# Patient Record
Sex: Male | Born: 1980 | Race: White | Hispanic: No | Marital: Single | Smoking: Former smoker
Health system: Southern US, Community
[De-identification: ages and names within clinical notes are randomized; demographics above are authoritative.]

## PROBLEM LIST (undated history)

## (undated) ENCOUNTER — Ambulatory Visit

## (undated) DIAGNOSIS — I10 Essential (primary) hypertension: Secondary | ICD-10-CM

## (undated) DIAGNOSIS — S060XAA Concussion with loss of consciousness status unknown, initial encounter: Secondary | ICD-10-CM

## (undated) DIAGNOSIS — S060X9A Concussion with loss of consciousness of unspecified duration, initial encounter: Secondary | ICD-10-CM

## (undated) DIAGNOSIS — F4310 Post-traumatic stress disorder, unspecified: Secondary | ICD-10-CM

## (undated) DIAGNOSIS — N3281 Overactive bladder: Secondary | ICD-10-CM

## (undated) DIAGNOSIS — E119 Type 2 diabetes mellitus without complications: Secondary | ICD-10-CM

## (undated) HISTORY — PX: CHOLECYSTECTOMY: SHX55

## (undated) HISTORY — PX: COLONOSCOPY: SHX174

---

## 2015-06-07 ENCOUNTER — Ambulatory Visit (HOSPITAL_BASED_OUTPATIENT_CLINIC_OR_DEPARTMENT_OTHER): Payer: 59

## 2015-06-07 ENCOUNTER — Encounter (HOSPITAL_BASED_OUTPATIENT_CLINIC_OR_DEPARTMENT_OTHER): Payer: 59

## 2015-06-14 ENCOUNTER — Ambulatory Visit (HOSPITAL_BASED_OUTPATIENT_CLINIC_OR_DEPARTMENT_OTHER): Payer: Managed Care, Other (non HMO)

## 2015-06-15 ENCOUNTER — Ambulatory Visit (HOSPITAL_BASED_OUTPATIENT_CLINIC_OR_DEPARTMENT_OTHER): Payer: 59 | Attending: Family Medicine | Admitting: Internal Medicine

## 2015-06-15 VITALS — Ht 72.0 in

## 2015-06-15 DIAGNOSIS — R0683 Snoring: Secondary | ICD-10-CM | POA: Diagnosis present

## 2015-06-15 DIAGNOSIS — G4730 Sleep apnea, unspecified: Secondary | ICD-10-CM | POA: Insufficient documentation

## 2015-06-15 DIAGNOSIS — G4710 Hypersomnia, unspecified: Secondary | ICD-10-CM | POA: Diagnosis not present

## 2015-06-30 ENCOUNTER — Other Ambulatory Visit (HOSPITAL_BASED_OUTPATIENT_CLINIC_OR_DEPARTMENT_OTHER): Payer: Self-pay

## 2015-06-30 DIAGNOSIS — G4710 Hypersomnia, unspecified: Secondary | ICD-10-CM

## 2015-06-30 DIAGNOSIS — G4730 Sleep apnea, unspecified: Secondary | ICD-10-CM

## 2015-06-30 DIAGNOSIS — R0683 Snoring: Secondary | ICD-10-CM

## 2015-07-04 DIAGNOSIS — G4710 Hypersomnia, unspecified: Secondary | ICD-10-CM

## 2015-07-04 DIAGNOSIS — R0683 Snoring: Secondary | ICD-10-CM

## 2015-07-04 DIAGNOSIS — G4730 Sleep apnea, unspecified: Secondary | ICD-10-CM | POA: Diagnosis not present

## 2015-07-04 NOTE — Procedures (Signed)
   Patient Name: Delorise RoyalsKospender, Maximilian Study Date: 06/17/2015 Gender: Male D.O.B: 1981-01-02 Age (years): 34 Referring Provider: Knox RoyaltyEnrico Jones Height (inches): 72 Interpreting Physician: Jetty Duhamellinton Ameka Krigbaum MD, ABSM Weight (lbs): 432 RPSGT: [place] SinkBarksdale, [place] BMI: 59 MRN: [id number] Neck Size: 19.50 CLINICAL INFORMATION Sleep Study Type: unattended home sleep test   Indication for sleep study: hypersomnia with sleep apnea   Epworth Sleepiness Score: 14 SLEEP STUDY TECHNIQUE A multi-channel overnight portable sleep study was performed. The channels recorded were: nasal airflow, thoracic respiratory movement, and oxygen saturation with a pulse oximetry. Snoring was also monitored. MEDICATIONS Patient self administered medications during sleep study include: none reported.  SLEEP ARCHITECTURE Patient was studied for 435.7 minutes. The sleep efficiency was 100.0 % and the patient was supine for 21%. The arousal index was 0.0 per hour.  RESPIRATORY PARAMETERS The overall AHI was 57.8 per hour, with a central apnea index of 0.0 per hour. The oxygen nadir was 71% during sleep.  CARDIAC DATA Mean heart rate during sleep was 80.1 bpm.  IMPRESSIONS - Severe obstructive sleep apnea occurred during this study (AHI = 57.8/h). - No significant central sleep apnea occurred during this study (CAI = 0.0/h). - Severe oxygen desaturation was noted during this study (Min O2 = 71%). - Patient snored 7.2% during the sleep. DIAGNOSIS - Obstructive Sleep Apnea (327.23 [G47.33 ICD-10]) - Nocturnal Hypoxemia (327.26 [G47.36 ICD-10])  RECOMMENDATIONS - Recommend CPAP titration and trial of CPAP therapy. - Positional therapy avoiding supine position during sleep. - Avoid alcohol, sedatives and other CNS depressants that may worsen sleep apnea and disrupt normal sleep architecture. - Sleep hygiene should be reviewed to assess factors that may improve sleep quality. - Weight management and regular exercise  should be initiated or continued.   Waymon BudgeYOUNG,Charles Andringa D Diplomate, American Board of Sleep Medicine  ELECTRONICALLY SIGNED ON:  07/04/2015, 2:32 PM  [facility] PH: [phone]   FX: [phone] ACCREDITED BY THE AMERICAN ACADEMY OF SLEEP MEDICINE

## 2015-07-12 ENCOUNTER — Emergency Department (HOSPITAL_BASED_OUTPATIENT_CLINIC_OR_DEPARTMENT_OTHER): Payer: Worker's Compensation

## 2015-07-12 ENCOUNTER — Emergency Department (HOSPITAL_BASED_OUTPATIENT_CLINIC_OR_DEPARTMENT_OTHER)
Admission: EM | Admit: 2015-07-12 | Discharge: 2015-07-12 | Disposition: A | Discharge status: Home / Self Care | Payer: Worker's Compensation | Attending: Emergency Medicine | Admitting: Emergency Medicine

## 2015-07-12 ENCOUNTER — Encounter (HOSPITAL_BASED_OUTPATIENT_CLINIC_OR_DEPARTMENT_OTHER): Payer: Self-pay | Admitting: *Deleted

## 2015-07-12 DIAGNOSIS — Y999 Unspecified external cause status: Secondary | ICD-10-CM | POA: Diagnosis not present

## 2015-07-12 DIAGNOSIS — W312XXA Contact with powered woodworking and forming machines, initial encounter: Secondary | ICD-10-CM | POA: Diagnosis not present

## 2015-07-12 DIAGNOSIS — Z7984 Long term (current) use of oral hypoglycemic drugs: Secondary | ICD-10-CM | POA: Insufficient documentation

## 2015-07-12 DIAGNOSIS — IMO0001 Reserved for inherently not codable concepts without codable children: Secondary | ICD-10-CM

## 2015-07-12 DIAGNOSIS — Y939 Activity, unspecified: Secondary | ICD-10-CM | POA: Insufficient documentation

## 2015-07-12 DIAGNOSIS — Y929 Unspecified place or not applicable: Secondary | ICD-10-CM | POA: Diagnosis not present

## 2015-07-12 DIAGNOSIS — E119 Type 2 diabetes mellitus without complications: Secondary | ICD-10-CM | POA: Diagnosis not present

## 2015-07-12 DIAGNOSIS — R030 Elevated blood-pressure reading, without diagnosis of hypertension: Secondary | ICD-10-CM | POA: Insufficient documentation

## 2015-07-12 DIAGNOSIS — I10 Essential (primary) hypertension: Secondary | ICD-10-CM | POA: Insufficient documentation

## 2015-07-12 DIAGNOSIS — S51812A Laceration without foreign body of left forearm, initial encounter: Secondary | ICD-10-CM | POA: Diagnosis not present

## 2015-07-12 DIAGNOSIS — S4992XA Unspecified injury of left shoulder and upper arm, initial encounter: Secondary | ICD-10-CM | POA: Diagnosis present

## 2015-07-12 DIAGNOSIS — IMO0002 Reserved for concepts with insufficient information to code with codable children: Secondary | ICD-10-CM

## 2015-07-12 HISTORY — DX: Type 2 diabetes mellitus without complications: E11.9

## 2015-07-12 HISTORY — DX: Essential (primary) hypertension: I10

## 2015-07-12 MED ORDER — CEPHALEXIN 250 MG PO CAPS
500.0000 mg | ORAL_CAPSULE | Freq: Once | ORAL | Status: AC
  Administered 2015-07-12: 500 mg via ORAL
  Filled 2015-07-12: qty 2

## 2015-07-12 MED ORDER — CEPHALEXIN 500 MG PO CAPS: 500.0000 mg | ORAL_CAPSULE | Freq: Four times a day (QID) | ORAL | Status: DC

## 2015-07-12 MED ORDER — BACITRACIN ZINC 500 UNIT/GM EX OINT
1.0000 "application " | TOPICAL_OINTMENT | Freq: Once | CUTANEOUS | Status: AC
  Administered 2015-07-12: 1 via TOPICAL

## 2015-07-12 MED ORDER — LIDOCAINE-EPINEPHRINE (PF) 2 %-1:200000 IJ SOLN
20.0000 mL | Freq: Once | INTRAMUSCULAR | Status: AC
  Administered 2015-07-12: 20 mL via INTRADERMAL
  Filled 2015-07-12: qty 20

## 2015-07-12 NOTE — Discharge Instructions (Signed)
Keep wound dry and do not remove dressing for 24 hours if possible. After that, wash gently morning and night (every 12 hours) with soap and water. Use a topical antibiotic ointment and cover with a bandaid or gauze.    Do NOT use rubbing alcohol or hydrogen peroxide, do not soak the area   Present to your primary care doctor or the urgent care of your choice, or the ED for suture removal in 7-10 days.   Every attempt was made to remove foreign body (contaminants) from the wound.  However, there is always a chance that some may remain in the wound. This can  increase your risk of infection.   If you see signs of infection (warmth, redness, tenderness, pus, sharp increase in pain, fever, red streaking in the skin) immediately return to the emergency department.   After the wound heals fully, apply sunscreen for 6-12 months to minimize scarring.   Please follow with your primary care doctor in the next 5 days for high blood pressure evaluation. If you do not have a primary care doctor, present to urgent care. Reduce salt intake. Seek emergency medical care for unilateral weakness, slurring, change in vision, or chest pain and shortness of breath.

## 2015-07-12 NOTE — ED Notes (Signed)
Supplies to bedside for wound repair.

## 2015-07-12 NOTE — ED Provider Notes (Signed)
CSN: [id number]     Arrival date & time 07/12/15  1931 History   First MD Initiated Contact with Patient 07/12/15 2047     Chief Complaint  Patient presents with  . Arm Injury     (Consider location/radiation/quality/duration/timing/severity/associated sxs/prior Treatment) Patient is a 35 y.o. male presenting with arm injury.  Arm Injury   Blood pressure 162/100, pulse 87, temperature 99.3 F (37.4 C), temperature source Oral, resp. rate 20, height 6' (1.829 m), weight 193.686 kg, SpO2 98 %.  Isaiah Sullivan is a 35 y.o. male complaining of laceration to left forearm, patient was standing with his dominant (right) forearm, he turned to speak to somebody behind him and the sander went into his left hand. States his last tetanus shot was within last 5 years, pain is minimal, bleeding is controlled. Patient denies weakness, numbness, reduced range of motion.  Past Medical History  Diagnosis Date  . Diabetes mellitus without complication (HCC)   . Hypertension    Past Surgical History  Procedure Laterality Date  . Cholecystectomy     No family history on file. Social History  Substance Use Topics  . Smoking status: Never Smoker   . Smokeless tobacco: None  . Alcohol Use: Yes    Review of Systems  10 systems reviewed and found to be negative, except as noted in the HPI.   Allergies  Review of patient's allergies indicates no known allergies.  Home Medications   Prior to Admission medications   Medication Sig Start Date End Date Taking? Authorizing Provider  METFORMIN HCL PO Take by mouth.   Yes Historical Provider, MD   BP 162/100 mmHg  Pulse 87  Temp(Src) 99.3 F (37.4 C) (Oral)  Resp 20  Ht 6' (1.829 m)  Wt 193.686 kg  BMI 57.90 kg/m2  SpO2 98% Physical Exam  Constitutional: He is oriented to person, place, and time. He appears well-developed and well-nourished. No distress.  HENT:  Head: Normocephalic.  Eyes: Conjunctivae and EOM are normal.    Cardiovascular: Normal rate, regular rhythm and intact distal pulses.   Pulmonary/Chest: Effort normal. No stridor.  Musculoskeletal: Normal range of motion.  Neurological: He is alert and oriented to person, place, and time.  Skin:     3 cm full-thickness non-jagged laceration as diagrammed full range of motion to the fingers, distally neurovascularly intact.  Psychiatric: He has a normal mood and affect.  Nursing note and vitals reviewed.   ED Course  .Marland Kitchen.Laceration Repair Date/Time: 07/12/2015 9:47 PM Performed by: Wynetta EmeryPISCIOTTA, Raylene Carmickle Authorized by: Wynetta EmeryPISCIOTTA, Kenyotta Dorfman Consent: Verbal consent obtained. Risks and benefits: risks, benefits and alternatives were discussed Consent given by: patient Required items: required blood products, implants, devices, and special equipment available Patient identity confirmed: verbally with patient Body area: upper extremity Location details: left lower arm Laceration length: 3 cm Foreign bodies: no foreign bodies Tendon involvement: none Nerve involvement: none Vascular damage: no Anesthesia: local infiltration Local anesthetic: lidocaine 2% with epinephrine Anesthetic total: 5 ml Patient sedated: no Preparation: Patient was prepped and draped in the usual sterile fashion. Irrigation solution: saline Irrigation method: syringe Amount of cleaning: standard Debridement: none Degree of undermining: none Skin closure: Ethilon (4-0) Number of sutures: 4 Technique: running Approximation: close Approximation difficulty: simple Dressing: antibiotic ointment Patient tolerance: Patient tolerated the procedure well with no immediate complications   (including critical care time) Labs Review Labs Reviewed - No data to display  Imaging Review Dg Forearm Left  07/12/2015  CLINICAL DATA:  Left forearm injury.  EXAM: LEFT FOREARM - 2 VIEW COMPARISON:  None. FINDINGS: There is no evidence of fracture or other focal bone lesions. Soft tissues are  unremarkable. IMPRESSION: Normal. Electronically Signed   By: Signa Kell M.D.   On: 07/12/2015 20:16   I have personally reviewed and evaluated these images and lab results as part of my medical decision-making.   EKG Interpretation None      MDM   Final diagnoses:  Laceration  Elevated blood pressure    Filed Vitals:   07/12/15 1942  BP: 162/100  Pulse: 87  Temp: 99.3 F (37.4 C)  TempSrc: Oral  Resp: 20  Height: 6' (1.829 m)  Weight: 193.686 kg  SpO2: 98%    Medications  bacitracin ointment 1 application (not administered)  lidocaine-EPINEPHrine (XYLOCAINE W/EPI) 2 %-1:200000 (PF) injection 20 mL (20 mLs Intradermal Given by Other 07/12/15 2107)  cephALEXin (KEFLEX) capsule 500 mg (500 mg Oral Given 07/12/15 2107)    Isaiah Sullivan is 35 y.o. male presenting with Laceration to left forearm, neurovascularly intact, no tendon involvement. He states his tetanus shot is up-to-date, wound is cleaned and closed, patient is started on Keflex as he is a diabetic.   Evaluation does not show pathology that would require ongoing emergent intervention or inpatient treatment. Pt is hemodynamically stable and mentating appropriately. Discussed findings and plan with patient/guardian, who agrees with care plan. All questions answered. Return precautions discussed and outpatient follow up given.   New Prescriptions   CEPHALEXIN (KEFLEX) 500 MG CAPSULE    Take 1 capsule (500 mg total) by mouth 4 (four) times daily.         Wynetta Emery, PA-C 07/12/15 2209  Geoffery Lyons, MD 07/12/15 [phone]

## 2015-07-12 NOTE — ED Notes (Signed)
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 No drug screen required.

## 2015-07-12 NOTE — ED Notes (Signed)
PA at bedside for wound repair.

## 2016-03-13 ENCOUNTER — Ambulatory Visit: Payer: Self-pay | Admitting: Orthopedic Surgery

## 2016-04-06 ENCOUNTER — Encounter (HOSPITAL_COMMUNITY)
Admission: RE | Admit: 2016-04-06 | Discharge: 2016-04-06 | Disposition: A | Discharge status: Home / Self Care | Payer: Worker's Compensation | Source: Ambulatory Visit | Attending: Orthopedic Surgery | Admitting: Orthopedic Surgery

## 2016-04-06 ENCOUNTER — Encounter (HOSPITAL_COMMUNITY): Payer: Self-pay

## 2016-04-06 DIAGNOSIS — G5600 Carpal tunnel syndrome, unspecified upper limb: Secondary | ICD-10-CM | POA: Diagnosis not present

## 2016-04-06 DIAGNOSIS — Z01812 Encounter for preprocedural laboratory examination: Secondary | ICD-10-CM | POA: Diagnosis present

## 2016-04-06 DIAGNOSIS — Z01810 Encounter for preprocedural cardiovascular examination: Secondary | ICD-10-CM | POA: Diagnosis present

## 2016-04-06 DIAGNOSIS — I10 Essential (primary) hypertension: Secondary | ICD-10-CM | POA: Insufficient documentation

## 2016-04-06 HISTORY — DX: Concussion with loss of consciousness status unknown, initial encounter: S06.0XAA

## 2016-04-06 HISTORY — DX: Concussion with loss of consciousness of unspecified duration, initial encounter: S06.0X9A

## 2016-04-06 HISTORY — DX: Post-traumatic stress disorder, unspecified: F43.10

## 2016-04-06 LAB — BASIC METABOLIC PANEL
Anion gap: 7 (ref 5–15)
BUN: 12 mg/dL (ref 6–20)
CO2: 26 mmol/L (ref 22–32)
Calcium: 9.5 mg/dL (ref 8.9–10.3)
Chloride: 106 mmol/L (ref 101–111)
Creatinine, Ser: 0.8 mg/dL (ref 0.61–1.24)
Glucose, Bld: 122 mg/dL — ABNORMAL HIGH (ref 65–99)
Potassium: 3.8 mmol/L (ref 3.5–5.1)
SODIUM: 139 mmol/L (ref 135–145)

## 2016-04-06 LAB — GLUCOSE, CAPILLARY: GLUCOSE-CAPILLARY: 105 mg/dL — AB (ref 65–99)

## 2016-04-06 LAB — CBC
HEMATOCRIT: 43.6 % (ref 39.0–52.0)
Hemoglobin: 14.2 g/dL (ref 13.0–17.0)
MCH: 27.3 pg (ref 26.0–34.0)
MCHC: 32.6 g/dL (ref 30.0–36.0)
MCV: 83.7 fL (ref 78.0–100.0)
PLATELETS: 276 10*3/uL (ref 150–400)
RBC: 5.21 MIL/uL (ref 4.22–5.81)
RDW: 14.6 % (ref 11.5–15.5)
WBC: 11.2 10*3/uL — AB (ref 4.0–10.5)

## 2016-04-06 NOTE — Pre-Procedure Instructions (Addendum)
Isaiah Sullivan  04/06/2016    Your procedure is scheduled on Thursday, February 8.  [facility]:00 AM                For any other questions, please call [phone], Monday - Friday 8 AM - 4 PM.    Call this number if you have problems the morning of surgery: [phone]                For any other questions, please call [phone], Monday - Friday 8 AM - 4 PM.     Remember:  Do not eat food or drink liquids after midnight Wednesday, February 7.  Take these medicines the morning of surgery with A SIP OF WATER:atorvastatin (LIPITOR).                   DO NOT Take   metFORMIN (GLUCOPHAGE) the Morning of Surgery.             1 Week prior to surgery STOP taking Aspirin, Aspirin Products (Goody Powder, Excedrin Migraine), Ibuprofen (Advil), Naproxen (Aleve), Ibuprofen- Famotidine, Vitamins and Herbal Products (ie Fish Oil).  How to Manage Your Diabetes Before and After Surgery  Why is it important to control my blood sugar before and after surgery? . Improving blood sugar levels before and after surgery helps healing and can limit problems. . A way of improving blood sugar control is eating a healthy diet by: o  Eating less sugar and carbohydrates o  Increasing activity/exercise o  Talking with your doctor about reaching your blood sugar goals . High blood sugars (greater than 180 mg/dL) can raise your risk of infections and slow your recovery, so you will need to focus on controlling your diabetes during the weeks before surgery. . Make sure that the doctor who takes care of your diabetes knows about your planned surgery including the date and location.  How do I manage my blood sugar before surgery? . Check your blood sugar at least 4 times a day, starting 2 days before surgery, to make sure that the level is not too high or low. o Check your blood sugar the morning of your surgery when you wake up and every 2 hours until you get to the  Short Stay unit. . If your blood sugar is less than 70 mg/dL, you will need to treat for low blood sugar: o Do not take insulin. o Treat a low blood sugar (less than 70 mg/dL) with  cup of clear juice (cranberry or apple), 4 glucose tablets, OR glucose gel. o Recheck blood sugar in 15 minutes after treatment (to make sure it is greater than 70 mg/dL). If your blood sugar is not greater than 70 mg/dL on recheck, call [phone] for further instructions. . Report your blood sugar to the short stay nurse when you get to Short Stay.  . If you are admitted to the hospital after surgery: o Your blood sugar will be checked by the staff and you will probably be given insulin after surgery (instead of oral diabetes medicines) to make sure you have good blood sugar levels. o The goal for blood sugar control after surgery is 80-180 mg/dL.   WHAT DO I DO ABOUT MY DIABETES MEDICATION?  Marland Kitchen. Do not take oral diabetes medicines (pills) the morning of surgery.  Patient Signature:  Date:   Nurse Signature:  Date:    Do not wear jewelry, make-up or nail polish.  Do not wear lotions, powders, or perfumes, or deodorant.   Men may shave face and neck.  Do not bring valuables to the hospital.  [facility] is not responsible for any belongings or valuables.  Contacts, dentures or bridgework may not be worn into surgery.  Leave your suitcase in the car.  After surgery it may be brought to your room.  For patients admitted to the hospital, discharge time will be determined by your treatment team.  Patients discharged the day of surgery will not be allowed to drive home.   Name and phone number of your driver:     Special instructions: Review  [facility] - Preparing For Surgery.  Please read over the following fact sheets that you were given: [facility]- Preparing For Surgery and Patient Instructions for Mupirocin Application, Pain Booklet  Patient Signature:  Date:   Nurse Signature:  Date:

## 2016-04-07 LAB — HEMOGLOBIN A1C
Hgb A1c MFr Bld: 6.8 % — ABNORMAL HIGH (ref 4.8–5.6)
MEAN PLASMA GLUCOSE: 148 mg/dL

## 2016-04-12 MED ORDER — DEXTROSE 5 % IV SOLN
3.0000 g | INTRAVENOUS | Status: AC
  Administered 2016-04-13: 3 g via INTRAVENOUS
  Filled 2016-04-12: qty 3000

## 2016-04-13 ENCOUNTER — Ambulatory Visit (HOSPITAL_COMMUNITY): Payer: Worker's Compensation | Admitting: Anesthesiology

## 2016-04-13 ENCOUNTER — Encounter (HOSPITAL_COMMUNITY)
Admission: RE | Disposition: A | Discharge status: Home / Self Care | Payer: Self-pay | Source: Ambulatory Visit | Attending: Orthopedic Surgery

## 2016-04-13 ENCOUNTER — Ambulatory Visit (HOSPITAL_COMMUNITY)
Admission: RE | Admit: 2016-04-13 | Discharge: 2016-04-13 | Disposition: A | Discharge status: Home / Self Care | Payer: Worker's Compensation | Source: Ambulatory Visit | Attending: Orthopedic Surgery | Admitting: Orthopedic Surgery

## 2016-04-13 ENCOUNTER — Encounter (HOSPITAL_COMMUNITY): Payer: Self-pay | Admitting: *Deleted

## 2016-04-13 ENCOUNTER — Ambulatory Visit (HOSPITAL_COMMUNITY): Payer: Worker's Compensation | Admitting: Emergency Medicine

## 2016-04-13 DIAGNOSIS — F4310 Post-traumatic stress disorder, unspecified: Secondary | ICD-10-CM | POA: Diagnosis not present

## 2016-04-13 DIAGNOSIS — I10 Essential (primary) hypertension: Secondary | ICD-10-CM | POA: Insufficient documentation

## 2016-04-13 DIAGNOSIS — E119 Type 2 diabetes mellitus without complications: Secondary | ICD-10-CM | POA: Insufficient documentation

## 2016-04-13 DIAGNOSIS — Z79899 Other long term (current) drug therapy: Secondary | ICD-10-CM | POA: Insufficient documentation

## 2016-04-13 DIAGNOSIS — G5601 Carpal tunnel syndrome, right upper limb: Secondary | ICD-10-CM | POA: Diagnosis present

## 2016-04-13 DIAGNOSIS — Z7984 Long term (current) use of oral hypoglycemic drugs: Secondary | ICD-10-CM | POA: Diagnosis not present

## 2016-04-13 DIAGNOSIS — Z87891 Personal history of nicotine dependence: Secondary | ICD-10-CM | POA: Insufficient documentation

## 2016-04-13 HISTORY — PX: CARPAL TUNNEL RELEASE: SHX101

## 2016-04-13 LAB — GLUCOSE, CAPILLARY
GLUCOSE-CAPILLARY: 93 mg/dL (ref 65–99)
Glucose-Capillary: 93 mg/dL (ref 65–99)

## 2016-04-13 SURGERY — CARPAL TUNNEL RELEASE
Anesthesia: Monitor Anesthesia Care | Site: Wrist | Laterality: Right

## 2016-04-13 MED ORDER — ONDANSETRON HCL 4 MG/2ML IJ SOLN
INTRAMUSCULAR | Status: AC
  Filled 2016-04-13: qty 2

## 2016-04-13 MED ORDER — LIDOCAINE 2% (20 MG/ML) 5 ML SYRINGE
INTRAMUSCULAR | Status: AC
  Filled 2016-04-13: qty 15

## 2016-04-13 MED ORDER — 0.9 % SODIUM CHLORIDE (POUR BTL) OPTIME
TOPICAL | Status: DC | PRN
  Administered 2016-04-13: 1000 mL

## 2016-04-13 MED ORDER — PROPOFOL 10 MG/ML IV BOLUS
INTRAVENOUS | Status: AC
  Filled 2016-04-13: qty 20

## 2016-04-13 MED ORDER — LIDOCAINE HCL (PF) 1 % IJ SOLN
INTRAMUSCULAR | Status: DC | PRN
  Administered 2016-04-13: 30 mL

## 2016-04-13 MED ORDER — FENTANYL CITRATE (PF) 100 MCG/2ML IJ SOLN
25.0000 ug | INTRAMUSCULAR | Status: DC | PRN
  Administered 2016-04-13 (×3): 50 ug via INTRAVENOUS

## 2016-04-13 MED ORDER — OXYCODONE-ACETAMINOPHEN 10-325 MG PO TABS: 1.0000 | ORAL_TABLET | ORAL | 0 refills | Status: DC | PRN

## 2016-04-13 MED ORDER — MIDAZOLAM HCL 5 MG/5ML IJ SOLN
INTRAMUSCULAR | Status: DC | PRN
  Administered 2016-04-13 (×2): 1 mg via INTRAVENOUS

## 2016-04-13 MED ORDER — FENTANYL CITRATE (PF) 100 MCG/2ML IJ SOLN
INTRAMUSCULAR | Status: DC | PRN
  Administered 2016-04-13: 100 ug via INTRAVENOUS

## 2016-04-13 MED ORDER — FENTANYL CITRATE (PF) 100 MCG/2ML IJ SOLN
INTRAMUSCULAR | Status: AC
  Filled 2016-04-13: qty 2

## 2016-04-13 MED ORDER — MIDAZOLAM HCL 2 MG/2ML IJ SOLN
INTRAMUSCULAR | Status: AC
  Filled 2016-04-13: qty 2

## 2016-04-13 MED ORDER — PROPOFOL 10 MG/ML IV BOLUS
INTRAVENOUS | Status: DC | PRN
  Administered 2016-04-13: 20 mg via INTRAVENOUS

## 2016-04-13 MED ORDER — PROPOFOL 500 MG/50ML IV EMUL
INTRAVENOUS | Status: DC | PRN
  Administered 2016-04-13: 50 ug/kg/min via INTRAVENOUS

## 2016-04-13 MED ORDER — CHLORHEXIDINE GLUCONATE 4 % EX LIQD: 60.0000 mL | Freq: Once | CUTANEOUS | Status: DC

## 2016-04-13 MED ORDER — SUGAMMADEX SODIUM 200 MG/2ML IV SOLN
INTRAVENOUS | Status: AC
  Filled 2016-04-13: qty 2

## 2016-04-13 MED ORDER — BUPIVACAINE HCL (PF) 0.25 % IJ SOLN
INTRAMUSCULAR | Status: AC
  Filled 2016-04-13: qty 30

## 2016-04-13 MED ORDER — BUPIVACAINE HCL (PF) 0.25 % IJ SOLN
INTRAMUSCULAR | Status: DC | PRN
  Administered 2016-04-13: 30 mL

## 2016-04-13 MED ORDER — LIDOCAINE HCL (PF) 1 % IJ SOLN
INTRAMUSCULAR | Status: AC
  Filled 2016-04-13: qty 30

## 2016-04-13 MED ORDER — DEXAMETHASONE SODIUM PHOSPHATE 10 MG/ML IJ SOLN
INTRAMUSCULAR | Status: AC
  Filled 2016-04-13: qty 1

## 2016-04-13 MED ORDER — LACTATED RINGERS IV SOLN
INTRAVENOUS | Status: DC | PRN
  Administered 2016-04-13: 13:00:00 via INTRAVENOUS

## 2016-04-13 MED ORDER — FENTANYL CITRATE (PF) 100 MCG/2ML IJ SOLN
INTRAMUSCULAR | Status: DC
Start: 2016-04-13 — End: 2016-04-13
  Filled 2016-04-13: qty 2

## 2016-04-13 MED ORDER — FENTANYL CITRATE (PF) 100 MCG/2ML IJ SOLN
INTRAMUSCULAR | Status: AC
  Filled 2016-04-13: qty 4

## 2016-04-13 SURGICAL SUPPLY — 47 items
BANDAGE ACE 4X5 VEL STRL LF (GAUZE/BANDAGES/DRESSINGS) ×3 IMPLANT
BANDAGE ELASTIC 3 VELCRO ST LF (GAUZE/BANDAGES/DRESSINGS) ×3 IMPLANT
BLADE CARPAL TUNNEL SNGL USE (BLADE) ×3 IMPLANT
BNDG GAUZE ELAST 4 BULKY (GAUZE/BANDAGES/DRESSINGS) ×3 IMPLANT
CORDS BIPOLAR (ELECTRODE) ×3 IMPLANT
COVER SURGICAL LIGHT HANDLE (MISCELLANEOUS) ×3 IMPLANT
CUFF TOURNIQUET SINGLE 18IN (TOURNIQUET CUFF) ×3 IMPLANT
CUFF TOURNIQUET SINGLE 24IN (TOURNIQUET CUFF) IMPLANT
DRAPE SURG 17X23 STRL (DRAPES) ×3 IMPLANT
EVACUATOR 1/8 PVC DRAIN (DRAIN) IMPLANT
GAUZE SPONGE 4X4 12PLY STRL (GAUZE/BANDAGES/DRESSINGS) ×3 IMPLANT
GAUZE XEROFORM 1X8 LF (GAUZE/BANDAGES/DRESSINGS) ×3 IMPLANT
GLOVE BIOGEL M 8.0 STRL (GLOVE) ×3 IMPLANT
GLOVE SS BIOGEL STRL SZ 8 (GLOVE) ×1 IMPLANT
GLOVE SUPERSENSE BIOGEL SZ 8 (GLOVE) ×2
GOWN STRL REUS W/ TWL LRG LVL3 (GOWN DISPOSABLE) ×2 IMPLANT
GOWN STRL REUS W/ TWL XL LVL3 (GOWN DISPOSABLE) ×3 IMPLANT
GOWN STRL REUS W/TWL LRG LVL3 (GOWN DISPOSABLE) ×4
GOWN STRL REUS W/TWL XL LVL3 (GOWN DISPOSABLE) ×6
KIT BASIN OR (CUSTOM PROCEDURE TRAY) ×3 IMPLANT
KIT ROOM TURNOVER OR (KITS) ×3 IMPLANT
LOOP VESSEL MAXI BLUE (MISCELLANEOUS) IMPLANT
NEEDLE HYPO 25GX1X1/2 BEV (NEEDLE) IMPLANT
NS IRRIG 1000ML POUR BTL (IV SOLUTION) ×3 IMPLANT
PACK ORTHO EXTREMITY (CUSTOM PROCEDURE TRAY) ×3 IMPLANT
PAD ABD 8X10 STRL (GAUZE/BANDAGES/DRESSINGS) ×6 IMPLANT
PAD ARMBOARD 7.5X6 YLW CONV (MISCELLANEOUS) ×6 IMPLANT
PAD CAST 3X4 CTTN HI CHSV (CAST SUPPLIES) ×2 IMPLANT
PAD CAST 4YDX4 CTTN HI CHSV (CAST SUPPLIES) ×2 IMPLANT
PADDING CAST COTTON 3X4 STRL (CAST SUPPLIES) ×4
PADDING CAST COTTON 4X4 STRL (CAST SUPPLIES) ×4
SCRUB BETADINE 4OZ XXX (MISCELLANEOUS) ×3 IMPLANT
SOLUTION BETADINE 4OZ (MISCELLANEOUS) ×6 IMPLANT
SPONGE SCRUB IODOPHOR (GAUZE/BANDAGES/DRESSINGS) ×3 IMPLANT
SUT PROLENE 4 0 PS 2 18 (SUTURE) ×3 IMPLANT
SUT VIC AB 2-0 CT1 27 (SUTURE)
SUT VIC AB 2-0 CT1 TAPERPNT 27 (SUTURE) IMPLANT
SUT VIC AB 3-0 FS2 27 (SUTURE) IMPLANT
SYR CONTROL 10ML LL (SYRINGE) IMPLANT
SYSTEM CHEST DRAIN TLS 7FR (DRAIN) IMPLANT
TOWEL OR 17X24 6PK STRL BLUE (TOWEL DISPOSABLE) ×3 IMPLANT
TOWEL OR 17X26 10 PK STRL BLUE (TOWEL DISPOSABLE) ×3 IMPLANT
TUBE CONNECTING 12'X1/4 (SUCTIONS)
TUBE CONNECTING 12X1/4 (SUCTIONS) IMPLANT
TUBE EVACUATION TLS (MISCELLANEOUS) ×3 IMPLANT
UNDERPAD 30X30 (UNDERPADS AND DIAPERS) ×3 IMPLANT
WATER STERILE IRR 1000ML POUR (IV SOLUTION) ×3 IMPLANT

## 2016-04-13 NOTE — Progress Notes (Signed)
3Orthopedic Tech Progress Note Patient Details:  Jaclynn GuarneriBryan Tomassi 1980/04/09 [id number]  Ortho Devices Type of Ortho Device: Arm sling Ortho Device/Splint Location: RUE Ortho Device/Splint Interventions: Ordered, Application   Jennye MoccasinHughes, Jahaan Vanwagner Craig 04/13/2016, 3:17 PM

## 2016-04-13 NOTE — Anesthesia Preprocedure Evaluation (Addendum)
Anesthesia Evaluation  Patient identified by MRN, date of birth, ID band Patient awake    Reviewed: Allergy & Precautions, NPO status , Patient's Chart, lab work & pertinent test results  Airway Mallampati: II  TM Distance: >3 FB Neck ROM: Full    Dental  (+) Teeth Intact, Dental Advisory Given   Pulmonary former smoker,    breath sounds clear to auscultation       Cardiovascular hypertension, Pt. on medications  Rhythm:Regular Rate:Normal     Neuro/Psych negative neurological ROS     GI/Hepatic negative GI ROS, Neg liver ROS,   Endo/Other  diabetes, Type 2, Oral Hypoglycemic AgentsMorbid obesity  Renal/GU negative Renal ROS     Musculoskeletal   Abdominal   Peds  Hematology   Anesthesia Other Findings   Reproductive/Obstetrics                            Lab Results  Component Value Date   WBC 11.2 (H) 04/06/2016   HGB 14.2 04/06/2016   HCT 43.6 04/06/2016   MCV 83.7 04/06/2016   PLT 276 04/06/2016   Lab Results  Component Value Date   CREATININE 0.80 04/06/2016   BUN 12 04/06/2016   NA 139 04/06/2016   K 3.8 04/06/2016   CL 106 04/06/2016   CO2 26 04/06/2016    Anesthesia Physical Anesthesia Plan  ASA: III  Anesthesia Plan: MAC   Post-op Pain Management:    Induction: Intravenous  Airway Management Planned: Natural Airway and Simple Face Mask  Additional Equipment: None  Intra-op Plan:   Post-operative Plan:   Informed Consent: I have reviewed the patients History and Physical, chart, labs and discussed the procedure including the risks, benefits and alternatives for the proposed anesthesia with the patient or authorized representative who has indicated his/her understanding and acceptance.   Dental advisory given  Plan Discussed with: CRNA, Anesthesiologist and Surgeon  Anesthesia Plan Comments:        Anesthesia Quick Evaluation

## 2016-04-13 NOTE — Discharge Instructions (Signed)

## 2016-04-13 NOTE — H&P (Signed)
Isaiah Sullivan is an 36 y.o. male.   Chief Complaint: right hand carpal tunnel syndrome HPI: 36 yo male with history  Of right carpal tunnel syndrome. Patient has attempted conservative treatment without resolution. Patient desires surgical intervention. Risks and benefits have been discussed at length.  Past Medical History:  Diagnosis Date  . Diabetes mellitus without complication (HCC)    Type II  . Head injury, closed, with concussion   . Hypertension   . PTSD (post-traumatic stress disorder)     Past Surgical History:  Procedure Laterality Date  . CHOLECYSTECTOMY    . COLONOSCOPY      History reviewed. No pertinent family history. Social History:  reports that he has quit smoking. He quit after 5.00 years of use. He has never used smokeless tobacco. He reports that he drinks about 0.6 oz of alcohol per week . He reports that he does not use drugs.  Allergies:  Allergies  Allergen Reactions  . No Known Allergies     Medications Prior to Admission  Medication Sig Dispense Refill  . atorvastatin (LIPITOR) 40 MG tablet Take 40 mg by mouth daily.    . calcium carbonate (TUMS - DOSED IN MG ELEMENTAL CALCIUM) 500 MG chewable tablet Chew 1 tablet by mouth as needed for indigestion or heartburn.    . Ibuprofen-Famotidine (DUEXIS) 800-26.6 MG TABS Take 1 tablet by mouth 3 (three) times daily.    Marland Kitchen losartan (COZAAR) 50 MG tablet Take 50 mg by mouth daily.    . metFORMIN (GLUCOPHAGE) 500 MG tablet Take 500 mg by mouth 2 (two) times daily with a meal.    . ranitidine (ZANTAC) 150 MG tablet Take 150 mg by mouth 2 (two) times daily.    Marland Kitchen omeprazole (PRILOSEC OTC) 20 MG tablet Take 20 mg by mouth as needed.      Results for orders placed or performed during the hospital encounter of 04/13/16 (from the past 48 hour(s))  Glucose, capillary     Status: None   Collection Time: 04/13/16 11:20 AM  Result Value Ref Range   Glucose-Capillary 93 65 - 99 mg/dL   Comment 1 Notify RN    Comment 2 Document in Chart    No results found.  Review of Systems  Constitutional: Negative.   Eyes: Negative.   Respiratory: Negative.   Cardiovascular: Negative.   Musculoskeletal:       See HPI  Skin: Negative.     Blood pressure (!) 150/85, pulse 83, temperature 98.4 F (36.9 C), temperature source Oral, resp. rate 18, weight (!) 205 kg (452 lb), SpO2 98 %. Physical Exam  The patient is alert and oriented in no acute distress. The patient complains of pain in the affected upper extremity.  The patient is noted to have a normal HEENT exam. Lung fields show equal chest expansion and no shortness of breath. Abdomen exam is nontender without distention. Lower extremity examination does not show any fracture dislocation or blood clot symptoms. Pelvis is stable and the neck and back are stable and nontender. RUE: positive tinels, positive mnc, phalens positive, no advanced atrophy, rom intact Assessment/Plan Right carpal tunnel syndrome There are no active problems to display for this patient. We are planning surgery for your upper extremity. The risk and benefits of surgery to include risk of bleeding, infection, anesthesia,  damage to normal structures and failure of the surgery to accomplish its intended goals of relieving symptoms and restoring function have been discussed in detail. With this in mind  we plan to proceed. I have specifically discussed with the patient the pre-and postoperative regime and the dos and don'ts and risk and benefits in great detail. Risk and benefits of surgery also include risk of dystrophy(CRPS), chronic nerve pain, failure of the healing process to go onto completion and other inherent risks of surgery The relavent the pathophysiology of the disease/injury process, as well as the alternatives for treatment and postoperative course of action has been discussed in great detail with the patient who desires to proceed.  We will do everything in our power  to help you (the patient) restore function to the upper extremity. It is a pleasure to see this patient today.   Kieu Quiggle L, PA-C 04/13/2016, 1:24 PM

## 2016-04-13 NOTE — Op Note (Signed)
See op [id number]#[id number] [facility] Isaiah Skousen MD

## 2016-04-13 NOTE — Progress Notes (Signed)
1445

## 2016-04-13 NOTE — Anesthesia Procedure Notes (Signed)
Procedure Name: MAC Date/Time: 04/13/2016 1:00 PM Performed by: Melina Copa, Delayne Sanzo R Pre-anesthesia Checklist: Patient identified, Emergency Drugs available, Suction available, Patient being monitored and Timeout performed Patient Re-evaluated:Patient Re-evaluated prior to inductionPlacement Confirmation: positive ETCO2 Dental Injury: Teeth and Oropharynx as per pre-operative assessment

## 2016-04-13 NOTE — Transfer of Care (Signed)
Immediate Anesthesia Transfer of Care Note  Patient: Isaiah Sullivan  Procedure(s) Performed: Procedure(s): RIGHT CARPAL TUNNEL RELEASE (Right)  Patient Location: PACU  Anesthesia Type:MAC  Level of Consciousness: awake, oriented and patient cooperative  Airway & Oxygen Therapy: Patient Spontanous Breathing  Post-op Assessment: Report given to RN, Post -op Vital signs reviewed and stable and Patient moving all extremities  Post vital signs: Reviewed and stable  Last Vitals:  Vitals:   04/13/16 1115 04/13/16 1116  BP:  (!) 150/85  Pulse: 83   Resp: 18   Temp: 36.9 C     Last Pain:  Vitals:   04/13/16 1115  TempSrc: Oral         Complications: No apparent anesthesia complications

## 2016-04-14 ENCOUNTER — Encounter (HOSPITAL_COMMUNITY): Payer: Self-pay | Admitting: Orthopedic Surgery

## 2016-04-14 NOTE — Op Note (Signed)
NAMEJENO, Isaiah NO.:  [id number]  MEDICAL RECORD NO.:  [id number]  LOCATION:  [facility]                         FACILITY:  [facility]  PHYSICIAN:  Dionne Ano. Debarah Mccumbers, M.D.DATE OF BIRTH:  05-16-1980  DATE OF PROCEDURE: DATE OF DISCHARGE:                              OPERATIVE REPORT   PREOPERATIVE DIAGNOSIS:  Carpal tunnel syndrome, right upper extremity.  POSTOPERATIVE DIAGNOSIS:  Carpal tunnel syndrome, right upper extremity.  PROCEDURES: 1. Right median nerve/peripheral nerve block at the wrist and forearm     level for anesthetic purposes for carpal tunnel release. 2. Right limited open carpal tunnel release.  SURGEON:  Dionne Ano. Amanda Pea, M.D.  ASSISTANT:  Karie Chimera, PA-C.  COMPLICATIONS:  None.  ANESTHESIA:  Peripheral nerve block with IV sedation, keeping the patient awake, alert, and oriented the entire case.  TOURNIQUET TIME:  Less than 10 minutes.  INDICATIONS:  A 36 year old male who presents for the above-mentioned diagnosis.  I have counseled him in regard to risks and benefits of surgery, and he has asked to proceed.  OPERATION IN DETAILS:  The patient was seen by myself and Anesthesia, taken to operative theater and underwent a light bit of IV sedation with Versed and associated agent.  Following this, he underwent a median nerve/peripheral nerve block with lidocaine and Sensorcaine with epinephrine mixture injected by myself at the wrist forearm level.  This provided adequate anesthesia.  Following this, he was prepped and draped in the usual sterile fashion.  He underwent a Hibiclens pre-scrub, followed by 10 minutes surgical Betadine scrub and paint.  Time-out was observed.  The arm was elevated, tourniquet was insufflated, and an incision was made 2 to 2.5 cm at the distal transcarpal ligament and coursing proximally.  Dissection was carried down and retractors placed.  Palmar fascia incised.  Distal transcarpal ligament was  identified and underwent complete release.  A 4.5 loupe magnification was used to verify fat pad aggression and protection of superficial palmar arch.  Following this, I dissected in a distal to proximal direction until adequate room was available for canal, prepared toward device 1, 2, and 3.  The canal prepared toward device was placed just under the proximal leading leaflet of transverse carpal ligament.  Following this, I placed the security clip, obturator disengaged, indicating correct placement.  With the patient awake, alert, and oriented, we then placed the security knife and security clip effectively releasing the proximal leaflet and portions of the antebrachial fascia.  This was withdrawn.  I then observed the canal.  It was completely released, all looked well.  There were no space-occupying lesions. Median nerve was hyperemic and intact, and there were no complicating features.  He had a very impressive wall thickness and all looked well.  Following this, the patient then underwent a very careful and cautious approach to the extremity with irrigation, followed by obtaining hemostasis, followed by closure of the wound with Prolene.  The patient tolerated this well.  He was dressed with standard soft dressing and a splint.  He will ice, elevate, and notify me should any problems occur. We will see him back in the office in approximately 7 days, therapy in 12 days for  suture removal.  Once he has recovered we will go ahead and move forward with higher level of intervention to include reconstruction of his opposite extremity with a left carpal tunnel release.  These notes have been discussed and all questions have been encouraged and answered.  It was a pleasure to see him today and participate in his care plan. Should any problems occur, be immediately available.  Pleasure to see him today.  All questions have been addressed.  This was an uncomplicated carpal tunnel  release.     Dionne AnoWilliam M. Amanda PeaGramig, M.D.     [facility]  D:  04/13/2016  T:  04/14/2016  Job:  [id number]

## 2016-04-14 NOTE — Anesthesia Postprocedure Evaluation (Signed)
Anesthesia Post Note  Patient: Isaiah Sullivan  Procedure(s) Performed: Procedure(s) (LRB): RIGHT CARPAL TUNNEL RELEASE (Right)  Patient location during evaluation: PACU Anesthesia Type: MAC Level of consciousness: awake and alert Pain management: pain level controlled Vital Signs Assessment: post-procedure vital signs reviewed and stable Respiratory status: spontaneous breathing, nonlabored ventilation, respiratory function stable and patient connected to nasal cannula oxygen Cardiovascular status: stable and blood pressure returned to baseline Anesthetic complications: no       Last Vitals:  Vitals:   04/13/16 1511 04/13/16 1512  BP:  (!) 145/82  Pulse: 79 79  Resp: 20 20  Temp:      Last Pain:  Vitals:   04/13/16 1512  TempSrc:   PainSc: 2                  Tiajuana Amass

## 2016-05-22 ENCOUNTER — Encounter (HOSPITAL_COMMUNITY): Payer: Self-pay | Admitting: *Deleted

## 2016-05-22 NOTE — Progress Notes (Signed)
Mr Isaiah Sullivan reports that he feels like his heart beats irregular occasionally. Patient denies pain, nausea or vomiting, lightheadedness, does not feel like it it racing. Patient states it has never been irregular when he is at Dr.

## 2016-05-23 ENCOUNTER — Ambulatory Visit (HOSPITAL_COMMUNITY): Payer: Worker's Compensation | Admitting: Anesthesiology

## 2016-05-23 ENCOUNTER — Encounter (HOSPITAL_COMMUNITY)
Admission: RE | Disposition: A | Discharge status: Home / Self Care | Payer: Self-pay | Source: Ambulatory Visit | Attending: Orthopedic Surgery

## 2016-05-23 ENCOUNTER — Ambulatory Visit (HOSPITAL_COMMUNITY)
Admission: RE | Admit: 2016-05-23 | Discharge: 2016-05-23 | Disposition: A | Discharge status: Home / Self Care | Payer: Worker's Compensation | Source: Ambulatory Visit | Attending: Orthopedic Surgery | Admitting: Orthopedic Surgery

## 2016-05-23 ENCOUNTER — Encounter (HOSPITAL_COMMUNITY): Payer: Self-pay | Admitting: *Deleted

## 2016-05-23 DIAGNOSIS — G5602 Carpal tunnel syndrome, left upper limb: Secondary | ICD-10-CM | POA: Insufficient documentation

## 2016-05-23 DIAGNOSIS — E119 Type 2 diabetes mellitus without complications: Secondary | ICD-10-CM | POA: Diagnosis not present

## 2016-05-23 DIAGNOSIS — Z79899 Other long term (current) drug therapy: Secondary | ICD-10-CM | POA: Insufficient documentation

## 2016-05-23 DIAGNOSIS — Z6841 Body Mass Index (BMI) 40.0 and over, adult: Secondary | ICD-10-CM | POA: Insufficient documentation

## 2016-05-23 DIAGNOSIS — Z7984 Long term (current) use of oral hypoglycemic drugs: Secondary | ICD-10-CM | POA: Insufficient documentation

## 2016-05-23 DIAGNOSIS — Z87891 Personal history of nicotine dependence: Secondary | ICD-10-CM | POA: Insufficient documentation

## 2016-05-23 DIAGNOSIS — I10 Essential (primary) hypertension: Secondary | ICD-10-CM | POA: Insufficient documentation

## 2016-05-23 HISTORY — DX: Concussion with loss of consciousness of unspecified duration, initial encounter: S06.0X9A

## 2016-05-23 HISTORY — DX: Concussion with loss of consciousness status unknown, initial encounter: S06.0XAA

## 2016-05-23 HISTORY — PX: CARPAL TUNNEL RELEASE: SHX101

## 2016-05-23 LAB — BASIC METABOLIC PANEL
ANION GAP: 9 (ref 5–15)
BUN: 12 mg/dL (ref 6–20)
CHLORIDE: 103 mmol/L (ref 101–111)
CO2: 27 mmol/L (ref 22–32)
Calcium: 9 mg/dL (ref 8.9–10.3)
Creatinine, Ser: 0.8 mg/dL (ref 0.61–1.24)
GFR calc Af Amer: >60 mL/min (ref >60)
GFR calc non Af Amer: >60 mL/min (ref >60)
GLUCOSE: 103 mg/dL — AB (ref 65–99)
Potassium: 3.8 mmol/L (ref 3.5–5.1)
Sodium: 139 mmol/L (ref 135–145)

## 2016-05-23 LAB — CBC
HCT: 44.6 % (ref 39.0–52.0)
HEMOGLOBIN: 14.5 g/dL (ref 13.0–17.0)
MCH: 27.5 pg (ref 26.0–34.0)
MCHC: 32.5 g/dL (ref 30.0–36.0)
MCV: 84.5 fL (ref 78.0–100.0)
Platelets: 285 10*3/uL (ref 150–400)
RBC: 5.28 MIL/uL (ref 4.22–5.81)
RDW: 14.9 % (ref 11.5–15.5)
WBC: 10.5 10*3/uL (ref 4.0–10.5)

## 2016-05-23 LAB — NO BLOOD PRODUCTS

## 2016-05-23 LAB — GLUCOSE, CAPILLARY
GLUCOSE-CAPILLARY: 91 mg/dL (ref 65–99)
Glucose-Capillary: 87 mg/dL (ref 65–99)

## 2016-05-23 SURGERY — CARPAL TUNNEL RELEASE
Anesthesia: Monitor Anesthesia Care | Site: Hand | Laterality: Left

## 2016-05-23 MED ORDER — LIDOCAINE HCL (PF) 1 % IJ SOLN
INTRAMUSCULAR | Status: AC
  Filled 2016-05-23: qty 30

## 2016-05-23 MED ORDER — FENTANYL CITRATE (PF) 100 MCG/2ML IJ SOLN: 25.0000 ug | INTRAMUSCULAR | Status: DC | PRN

## 2016-05-23 MED ORDER — BUPIVACAINE HCL (PF) 0.25 % IJ SOLN
INTRAMUSCULAR | Status: DC | PRN
  Administered 2016-05-23: 10 mL

## 2016-05-23 MED ORDER — FENTANYL CITRATE (PF) 100 MCG/2ML IJ SOLN
INTRAMUSCULAR | Status: AC
  Filled 2016-05-23: qty 2

## 2016-05-23 MED ORDER — LIDOCAINE HCL 1 % IJ SOLN
INTRAMUSCULAR | Status: DC | PRN
  Administered 2016-05-23: 10 mL

## 2016-05-23 MED ORDER — CHLORHEXIDINE GLUCONATE 4 % EX LIQD: 60.0000 mL | Freq: Once | CUTANEOUS | Status: DC

## 2016-05-23 MED ORDER — MIDAZOLAM HCL 5 MG/5ML IJ SOLN
INTRAMUSCULAR | Status: DC | PRN
  Administered 2016-05-23: 2 mg via INTRAVENOUS

## 2016-05-23 MED ORDER — OXYCODONE HCL 5 MG PO TABS: 5.0000 mg | ORAL_TABLET | Freq: Once | ORAL | Status: DC | PRN

## 2016-05-23 MED ORDER — OXYCODONE HCL 5 MG PO TABS: 5.0000 mg | ORAL_TABLET | ORAL | 0 refills | Status: AC | PRN

## 2016-05-23 MED ORDER — 0.9 % SODIUM CHLORIDE (POUR BTL) OPTIME
TOPICAL | Status: DC | PRN
  Administered 2016-05-23: 1000 mL

## 2016-05-23 MED ORDER — PROPOFOL 10 MG/ML IV BOLUS
INTRAVENOUS | Status: DC | PRN
  Administered 2016-05-23: 20 mg via INTRAVENOUS

## 2016-05-23 MED ORDER — BUPIVACAINE HCL (PF) 0.25 % IJ SOLN
INTRAMUSCULAR | Status: AC
  Filled 2016-05-23: qty 30

## 2016-05-23 MED ORDER — DEXTROSE 5 % IV SOLN
3.0000 g | INTRAVENOUS | Status: AC
  Administered 2016-05-23: 3 g via INTRAVENOUS
  Filled 2016-05-23: qty 3000

## 2016-05-23 MED ORDER — MIDAZOLAM HCL 2 MG/2ML IJ SOLN
INTRAMUSCULAR | Status: AC
  Filled 2016-05-23: qty 2

## 2016-05-23 MED ORDER — PROPOFOL 10 MG/ML IV BOLUS
INTRAVENOUS | Status: AC
  Filled 2016-05-23: qty 20

## 2016-05-23 MED ORDER — OXYCODONE HCL 5 MG/5ML PO SOLN: 5.0000 mg | Freq: Once | ORAL | Status: DC | PRN

## 2016-05-23 MED ORDER — LACTATED RINGERS IV SOLN
Freq: Once | INTRAVENOUS | Status: AC
  Administered 2016-05-23: 15:00:00 via INTRAVENOUS

## 2016-05-23 MED ORDER — FENTANYL CITRATE (PF) 100 MCG/2ML IJ SOLN
INTRAMUSCULAR | Status: DC | PRN
  Administered 2016-05-23 (×4): 25 ug via INTRAVENOUS

## 2016-05-23 MED ORDER — PROPOFOL 500 MG/50ML IV EMUL
INTRAVENOUS | Status: DC | PRN
Start: 2016-05-23 — End: 2016-05-23
  Administered 2016-05-23: 50 ug/kg/min via INTRAVENOUS

## 2016-05-23 SURGICAL SUPPLY — 51 items
BANDAGE ACE 3X5.8 VEL STRL LF (GAUZE/BANDAGES/DRESSINGS) ×3 IMPLANT
BANDAGE ACE 4X5 VEL STRL LF (GAUZE/BANDAGES/DRESSINGS) ×3 IMPLANT
BANDAGE ELASTIC 3 VELCRO ST LF (GAUZE/BANDAGES/DRESSINGS) ×6 IMPLANT
BLADE CARPAL TUNNEL SNGL USE (BLADE) ×3 IMPLANT
BNDG GAUZE ELAST 4 BULKY (GAUZE/BANDAGES/DRESSINGS) ×3 IMPLANT
CORDS BIPOLAR (ELECTRODE) ×3 IMPLANT
COVER SURGICAL LIGHT HANDLE (MISCELLANEOUS) ×3 IMPLANT
CUFF TOURNIQUET SINGLE 18IN (TOURNIQUET CUFF) IMPLANT
CUFF TOURNIQUET SINGLE 24IN (TOURNIQUET CUFF) ×3 IMPLANT
DECANTER SPIKE VIAL GLASS SM (MISCELLANEOUS) ×3 IMPLANT
DRAPE SURG 17X23 STRL (DRAPES) ×3 IMPLANT
DRSG EMULSION OIL 3X3 NADH (GAUZE/BANDAGES/DRESSINGS) ×3 IMPLANT
EVACUATOR 1/8 PVC DRAIN (DRAIN) IMPLANT
GAUZE SPONGE 4X4 12PLY STRL (GAUZE/BANDAGES/DRESSINGS) ×3 IMPLANT
GAUZE XEROFORM 1X8 LF (GAUZE/BANDAGES/DRESSINGS) ×3 IMPLANT
GLOVE BIOGEL M 8.0 STRL (GLOVE) ×6 IMPLANT
GLOVE SS BIOGEL STRL SZ 8 (GLOVE) ×2 IMPLANT
GLOVE SUPERSENSE BIOGEL SZ 8 (GLOVE) ×4
GOWN STRL REUS W/ TWL LRG LVL3 (GOWN DISPOSABLE) ×3 IMPLANT
GOWN STRL REUS W/ TWL XL LVL3 (GOWN DISPOSABLE) ×4 IMPLANT
GOWN STRL REUS W/TWL LRG LVL3 (GOWN DISPOSABLE) ×6
GOWN STRL REUS W/TWL XL LVL3 (GOWN DISPOSABLE) ×8
HOVERMATT SINGLE USE (MISCELLANEOUS) ×3 IMPLANT
KIT BASIN OR (CUSTOM PROCEDURE TRAY) ×3 IMPLANT
KIT ROOM TURNOVER OR (KITS) ×3 IMPLANT
LOOP VESSEL MAXI BLUE (MISCELLANEOUS) IMPLANT
NEEDLE HYPO 25GX1X1/2 BEV (NEEDLE) ×3 IMPLANT
NS IRRIG 1000ML POUR BTL (IV SOLUTION) ×3 IMPLANT
PACK ORTHO EXTREMITY (CUSTOM PROCEDURE TRAY) ×3 IMPLANT
PAD ARMBOARD 7.5X6 YLW CONV (MISCELLANEOUS) ×6 IMPLANT
PAD CAST 3X4 CTTN HI CHSV (CAST SUPPLIES) ×1 IMPLANT
PAD CAST 4YDX4 CTTN HI CHSV (CAST SUPPLIES) ×2 IMPLANT
PADDING CAST ABS 3INX4YD NS (CAST SUPPLIES) ×2
PADDING CAST ABS COTTON 3X4 (CAST SUPPLIES) ×1 IMPLANT
PADDING CAST COTTON 3X4 STRL (CAST SUPPLIES) ×2
PADDING CAST COTTON 4X4 STRL (CAST SUPPLIES) ×4
SCRUB BETADINE 4OZ XXX (MISCELLANEOUS) ×3 IMPLANT
SOLUTION BETADINE 4OZ (MISCELLANEOUS) ×6 IMPLANT
SPONGE SCRUB IODOPHOR (GAUZE/BANDAGES/DRESSINGS) ×3 IMPLANT
SUT PROLENE 4 0 PS 2 18 (SUTURE) IMPLANT
SUT VIC AB 2-0 CT1 27 (SUTURE)
SUT VIC AB 2-0 CT1 TAPERPNT 27 (SUTURE) IMPLANT
SUT VIC AB 3-0 FS2 27 (SUTURE) IMPLANT
SYR CONTROL 10ML LL (SYRINGE) ×6 IMPLANT
SYSTEM CHEST DRAIN TLS 7FR (DRAIN) IMPLANT
TOWEL OR 17X24 6PK STRL BLUE (TOWEL DISPOSABLE) ×3 IMPLANT
TOWEL OR 17X26 10 PK STRL BLUE (TOWEL DISPOSABLE) ×3 IMPLANT
TUBE CONNECTING 12'X1/4 (SUCTIONS)
TUBE CONNECTING 12X1/4 (SUCTIONS) IMPLANT
TUBE EVACUATION TLS (MISCELLANEOUS) IMPLANT
UNDERPAD 30X30 (UNDERPADS AND DIAPERS) ×3 IMPLANT

## 2016-05-23 NOTE — Anesthesia Preprocedure Evaluation (Signed)
Anesthesia Evaluation  Patient identified by MRN, date of birth, ID band Patient awake    Reviewed: Allergy & Precautions, NPO status , Patient's Chart, lab work & pertinent test results  History of Anesthesia Complications Negative for: history of anesthetic complications  Airway Mallampati: II  TM Distance: >3 FB Neck ROM: Full    Dental  (+) Teeth Intact, Dental Advisory Given   Pulmonary former smoker,    breath sounds clear to auscultation       Cardiovascular hypertension, Pt. on medications  Rhythm:Regular Rate:Normal     Neuro/Psych negative neurological ROS     GI/Hepatic negative GI ROS, Neg liver ROS,   Endo/Other  diabetes, Type 2, Oral Hypoglycemic AgentsMorbid obesity  Renal/GU negative Renal ROS     Musculoskeletal   Abdominal   Peds  Hematology   Anesthesia Other Findings   Reproductive/Obstetrics                             Anesthesia Physical Anesthesia Plan  ASA: III  Anesthesia Plan: MAC   Post-op Pain Management:    Induction: Intravenous  Airway Management Planned: Natural Airway, Nasal Cannula and Simple Face Mask  Additional Equipment: None  Intra-op Plan:   Post-operative Plan:   Informed Consent: I have reviewed the patients History and Physical, chart, labs and discussed the procedure including the risks, benefits and alternatives for the proposed anesthesia with the patient or authorized representative who has indicated his/her understanding and acceptance.   Dental advisory given  Plan Discussed with: CRNA and Surgeon  Anesthesia Plan Comments:         Anesthesia Quick Evaluation

## 2016-05-23 NOTE — Discharge Instructions (Signed)

## 2016-05-23 NOTE — H&P (Signed)
Isaiah Sullivan is an 36 y.o. male.   Chief Complaint: L CTS HPI: Patient presents for evaluation and treatment of the of their upper extremity predicament. The patient denies neck, back, chest or  abdominal pain. The patient notes that they have no lower extremity problems. The patients primary complaint is noted. We are planning surgical care pathway for the upper extremity.  Past Medical History:  Diagnosis Date  . Closed head injury with concussion   . Diabetes mellitus without complication (HCC)    Type II  . Head injury, closed, with concussion   . Hypertension   . PTSD (post-traumatic stress disorder)   . PTSD (post-traumatic stress disorder)     Past Surgical History:  Procedure Laterality Date  . CARPAL TUNNEL RELEASE Right 04/13/2016   Procedure: RIGHT CARPAL TUNNEL RELEASE;  Surgeon: Dominica SeverinWilliam Norvella Loscalzo, MD;  Location: [facility] OR;  Service: Orthopedics;  Laterality: Right;  . CHOLECYSTECTOMY    . COLONOSCOPY      History reviewed. No pertinent family history. Social History:  reports that he has quit smoking. He quit after 5.00 years of use. He has never used smokeless tobacco. He reports that he drinks about 0.6 oz of alcohol per week . He reports that he does not use drugs.  Allergies:  Allergies  Allergen Reactions  . No Known Allergies     Medications Prior to Admission  Medication Sig Dispense Refill  . atorvastatin (LIPITOR) 80 MG tablet Take 80 mg by mouth daily.    Marland Kitchen. losartan (COZAAR) 100 MG tablet Take 100 mg by mouth daily.    . metFORMIN (GLUCOPHAGE) 1000 MG tablet Take 1,000 mg by mouth 2 (two) times daily with a meal.    . ranitidine (ZANTAC) 150 MG tablet Take 150 mg by mouth 2 (two) times daily as needed for heartburn.     Marland Kitchen. oxyCODONE-acetaminophen (PERCOCET) 10-325 MG tablet Take 1 tablet by mouth every 4 (four) hours as needed for pain. (Patient not taking: Reported on 05/18/2016) 40 tablet 0    Results for orders placed or performed during the hospital  encounter of 05/23/16 (from the past 48 hour(s))  Glucose, capillary     Status: None   Collection Time: 05/23/16  1:31 PM  Result Value Ref Range   Glucose-Capillary 91 65 - 99 mg/dL   No results found.  Review of Systems  Respiratory: Negative.   Cardiovascular: Negative.   Gastrointestinal: Negative.   Genitourinary: Negative.     Blood pressure (!) 173/93, pulse 86, temperature 98.4 F (36.9 C), temperature source Oral, resp. rate 18, height 6' (1.829 m), weight (!) 204.1 kg (450 lb), SpO2 97 %. Physical Exam L CTS with positive Tinels MNCT and Phalens  The patient is alert and oriented in no acute distress. The patient complains of pain in the affected upper extremity.  The patient is noted to have a normal HEENT exam. Lung fields show equal chest expansion and no shortness of breath. Abdomen exam is nontender without distention. Lower extremity examination does not show any fracture dislocation or blood clot symptoms. Pelvis is stable and the neck and back are stable and nontender.  Assessment/Plan Plan L CTR  We are planning surgery for your upper extremity. The risk and benefits of surgery to include risk of bleeding, infection, anesthesia,  damage to normal structures and failure of the surgery to accomplish its intended goals of relieving symptoms and restoring function have been discussed in detail. With this in mind we plan to proceed. I  have specifically discussed with the patient the pre-and postoperative regime and the dos and don'ts and risk and benefits in great detail. Risk and benefits of surgery also include risk of dystrophy(CRPS), chronic nerve pain, failure of the healing process to go onto completion and other inherent risks of surgery The relavent the pathophysiology of the disease/injury process, as well as the alternatives for treatment and postoperative course of action has been discussed in great detail with the patient who desires to proceed.  We will do  everything in our power to help you (the patient) restore function to the upper extremity. It is a pleasure to see this patient today.   Karen Chafe, MD 05/23/2016, 1:54 PM

## 2016-05-23 NOTE — Transfer of Care (Signed)
Immediate Anesthesia Transfer of Care Note  Patient: Isaiah Sullivan  Procedure(s) Performed: Procedure(s) with comments: Left limited open carpal tunnel release  (Left) - Requests 45 mins  Patient Location: PACU  Anesthesia Type:MAC  Level of Consciousness: awake, alert  and oriented  Airway & Oxygen Therapy: Patient Spontanous Breathing and Patient connected to face mask oxygen  Post-op Assessment: Report given to RN, Post -op Vital signs reviewed and stable and Patient moving all extremities  Post vital signs: Reviewed and stable  Last Vitals:  Vitals:   05/23/16 1323 05/23/16 1538  BP: (!) 173/93 (!) 150/67  Pulse: 86 84  Resp: 18 19  Temp: 36.9 C 36.9 C    Last Pain:  Vitals:   05/23/16 1323  TempSrc: Oral      Patients Stated Pain Goal: 3 (59/74/16 3845)  Complications: No apparent anesthesia complications

## 2016-05-23 NOTE — Anesthesia Procedure Notes (Signed)
Procedure Name: MAC Date/Time: 05/23/2016 2:46 PM Performed by: Trixie Deis A Pre-anesthesia Checklist: Patient identified, Emergency Drugs available, Suction available, Timeout performed and Patient being monitored Patient Re-evaluated:Patient Re-evaluated prior to inductionOxygen Delivery Method: Simple face mask Placement Confirmation: positive ETCO2 Dental Injury: Teeth and Oropharynx as per pre-operative assessment

## 2016-05-23 NOTE — Op Note (Signed)
See full dictation SP L CTR  Darien Mignogna Md

## 2016-05-23 NOTE — Progress Notes (Signed)
Pt refuses all blood products, blood refusal form signed and faxed to lab.

## 2016-05-24 ENCOUNTER — Encounter (HOSPITAL_COMMUNITY): Payer: Self-pay | Admitting: Orthopedic Surgery

## 2016-05-24 NOTE — Op Note (Signed)
NAME:  Isaiah Sullivan, [place]                  ACCOUNT NO.:  MEDICAL RECORD NO.:  [id number]  LOCATION:                                 FACILITY:  PHYSICIAN:  Dionne AnoWilliam M. Jozelynn Danielson, M.D.DATE OF BIRTH:  [birth year]  DATE OF PROCEDURE:  05/23/2016 DATE OF DISCHARGE:                              OPERATIVE REPORT   PREOPERATIVE DIAGNOSIS:  Left carpal tunnel syndrome.  POSTOPERATIVE DIAGNOSIS:  Left carpal tunnel syndrome.  PROCEDURE: 1. Left median nerve/peripheral nerve block at the wrist-forearm level     for anesthetic purposes for carpal tunnel release. 2. Left limited open carpal tunnel release.  SURGEON:  Dionne AnoWilliam M. Amanda PeaGramig, M.D.  ASSISTANT:  Karie ChimeraBrian Buchanan, PA-C.  COMPLICATIONS:  None.  ANESTHESIA:  Peripheral nerve block with IV sedation keeping the patient awake, alert, oriented during the entire case.  TOURNIQUET TIME:  Less than 20 minutes.  INDICATIONS:  A 36 year old male with significant carpal tunnel syndrome, who presents for surgical release.  I have discussed the risks and benefits surgery including risk of infection, bleeding, anesthesia, damage to normal structures, and failure of surgery to accomplish its intended goals of relieving symptoms and restoring function.  With this in mind, he desires to proceed.  OPERATIVE PROCEDURE:  The patient seen by myself and Anesthesia.  He was counseled preoperatively, taken to the operative theater, underwent prophylactic antibiotic administration, followed by a pre-scrub with Hibiclens, followed by alcohol and local prep to the area, followed by a median nerve/peripheral nerve block with a mixture of 18 mL of 1% lidocaine and 0.5% Sensorcaine without epinephrine.  Following this, he underwent a 10 minutes surgical Betadine scrub, followed by isolation of sterile field.  Time-out was called.  Once this was complete, the arm was elevated and tourniquet was insufflated.  A less than 1 inch incision was made distally at the  distal edge of the transverse carpal ligament and coursed proximally.  Dissection was carried down.  Palmar fascia was incised.  He had very tremendous thickness of the palm and a large amount of adipose tissue.  We very carefully dissected down to the transverse carpal ligament, released this under 4.0 loupe magnification without difficulty.  Following this, I released him distally, verified fat pad aggression and complete distal release.  Superficial palmar arch was visualized and carefully protected.  Following this, distal proximal dissection was carried out until adequate room was available for canal, prepared toward device 1, 2, and 3, placed just under the proximal leading leaflet of transverse carpal ligament.  Following this, the patient then underwent placement of the security clip, followed by security knife into the security clip effectively releasing the proximal leaflet.  The patient was awake, alert, and oriented during all points and passes and did beautifully.  There were no complicating features. Following this, we then very carefully and cautiously performed irrigation followed by wound closure after hemostasis was complete.  The patient tolerated this well.  The patient will be monitored in recovery room, discharged to home.  We will see me in a week.  Therapy in 12-14 days for suture removal.  Our standard protocol be adhered to.  He has a very thick hand thus  he will likely have little bit more pillar pain than most, but this should subside as I have counseled them.  This was an uncomplicated left limited open carpal tunnel release.     Dionne Ano. Amanda Pea, M.D.     [facility]  D:  05/23/2016  T:  05/23/2016  Job:  [id number]

## 2016-05-26 NOTE — Anesthesia Postprocedure Evaluation (Addendum)
Anesthesia Post Note  Patient: Isaiah Sullivan  Procedure(s) Performed: Procedure(s) (LRB): Left limited open carpal tunnel release  (Left)  Patient location during evaluation: PACU Anesthesia Type: MAC Level of consciousness: awake and alert Pain management: pain level controlled Vital Signs Assessment: post-procedure vital signs reviewed and stable Respiratory status: spontaneous breathing, nonlabored ventilation, respiratory function stable and patient connected to nasal cannula oxygen Cardiovascular status: stable and blood pressure returned to baseline Anesthetic complications: no       Last Vitals:  Vitals:   05/23/16 1538 05/23/16 1553  BP: (!) 150/67 (!) 142/87  Pulse: 84 81  Resp: 19 18  Temp: 36.9 C 37 C    Last Pain:  Vitals:   05/23/16 1323  TempSrc: Oral                 Isaiah Sullivan

## 2016-08-07 NOTE — Addendum Note (Signed)
Addendum  created 08/07/16 1228 by Val EagleMoser, Damiano Stamper, MD   Sign clinical note

## 2018-09-04 ENCOUNTER — Other Ambulatory Visit: Payer: Self-pay | Admitting: Critical Care Medicine

## 2018-09-04 DIAGNOSIS — Z20822 Contact with and (suspected) exposure to covid-19: Secondary | ICD-10-CM

## 2018-09-09 LAB — NOVEL CORONAVIRUS, NAA: SARS-CoV-2, NAA: NOT DETECTED

## 2018-09-20 ENCOUNTER — Telehealth: Payer: Self-pay | Admitting: Hematology

## 2018-09-20 NOTE — Telephone Encounter (Signed)
Pt is calling and he is aware covid 19 test is negative °

## 2019-05-30 ENCOUNTER — Ambulatory Visit: Payer: Managed Care, Other (non HMO)

## 2019-11-05 ENCOUNTER — Other Ambulatory Visit: Payer: Self-pay | Admitting: Orthopedic Surgery

## 2019-11-05 DIAGNOSIS — M25511 Pain in right shoulder: Secondary | ICD-10-CM

## 2019-11-06 ENCOUNTER — Other Ambulatory Visit: Payer: Self-pay | Admitting: Orthopedic Surgery

## 2019-11-06 DIAGNOSIS — M25511 Pain in right shoulder: Secondary | ICD-10-CM

## 2019-11-07 ENCOUNTER — Other Ambulatory Visit: Payer: Self-pay | Admitting: Orthopedic Surgery

## 2019-11-07 DIAGNOSIS — S46912D Strain of unspecified muscle, fascia and tendon at shoulder and upper arm level, left arm, subsequent encounter: Secondary | ICD-10-CM

## 2019-11-25 ENCOUNTER — Other Ambulatory Visit: Payer: Managed Care, Other (non HMO)

## 2019-11-25 ENCOUNTER — Inpatient Hospital Stay: Admission: RE | Admit: 2019-11-25 | Payer: Managed Care, Other (non HMO) | Source: Ambulatory Visit

## 2019-12-04 ENCOUNTER — Other Ambulatory Visit: Payer: Self-pay | Admitting: Orthopedic Surgery

## 2019-12-04 ENCOUNTER — Other Ambulatory Visit: Payer: Managed Care, Other (non HMO)

## 2019-12-05 ENCOUNTER — Other Ambulatory Visit: Payer: Self-pay | Admitting: Orthopedic Surgery

## 2019-12-05 DIAGNOSIS — S46912D Strain of unspecified muscle, fascia and tendon at shoulder and upper arm level, left arm, subsequent encounter: Secondary | ICD-10-CM

## 2020-04-26 ENCOUNTER — Other Ambulatory Visit: Payer: Self-pay | Admitting: Cardiology

## 2020-04-26 ENCOUNTER — Ambulatory Visit
Admission: RE | Admit: 2020-04-26 | Discharge: 2020-04-26 | Disposition: A | Discharge status: Home / Self Care | Payer: No Typology Code available for payment source | Source: Ambulatory Visit | Attending: Cardiology | Admitting: Cardiology

## 2020-04-26 ENCOUNTER — Other Ambulatory Visit: Payer: Self-pay

## 2020-04-26 DIAGNOSIS — R0600 Dyspnea, unspecified: Secondary | ICD-10-CM

## 2020-04-26 DIAGNOSIS — R0789 Other chest pain: Secondary | ICD-10-CM

## 2020-04-26 DIAGNOSIS — R0609 Other forms of dyspnea: Secondary | ICD-10-CM

## 2020-04-26 DIAGNOSIS — R002 Palpitations: Secondary | ICD-10-CM

## 2020-04-26 DIAGNOSIS — I10 Essential (primary) hypertension: Secondary | ICD-10-CM

## 2020-05-12 ENCOUNTER — Ambulatory Visit
Admission: RE | Admit: 2020-05-12 | Discharge: 2020-05-12 | Disposition: A | Discharge status: Home / Self Care | Payer: Worker's Compensation | Source: Ambulatory Visit | Attending: Orthopedic Surgery | Admitting: Orthopedic Surgery

## 2020-05-12 DIAGNOSIS — M25511 Pain in right shoulder: Secondary | ICD-10-CM
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
## 2022-08-06 ENCOUNTER — Ambulatory Visit
Admission: EM | Admit: 2022-08-06 | Discharge: 2022-08-06 | Disposition: A | Discharge status: Home / Self Care | Payer: Self-pay

## 2022-08-06 DIAGNOSIS — B372 Candidiasis of skin and nail: Secondary | ICD-10-CM | POA: Insufficient documentation

## 2022-08-06 DIAGNOSIS — N3001 Acute cystitis with hematuria: Secondary | ICD-10-CM | POA: Insufficient documentation

## 2022-08-06 LAB — POCT URINALYSIS DIP (MANUAL ENTRY)
Bilirubin, UA: NEGATIVE
Glucose, UA: 100 mg/dL — AB
Ketones, POC UA: NEGATIVE mg/dL
Nitrite, UA: POSITIVE — AB
Protein Ur, POC: NEGATIVE mg/dL
Spec Grav, UA: 1.025 (ref 1.010–1.025)
Urobilinogen, UA: 1 E.U./dL
pH, UA: 5.5 (ref 5.0–8.0)

## 2022-08-06 MED ORDER — NYSTATIN 100000 UNIT/GM EX CREA: TOPICAL_CREAM | CUTANEOUS | 1 refills | Status: AC

## 2022-08-06 MED ORDER — CEPHALEXIN 500 MG PO CAPS: 500.0000 mg | ORAL_CAPSULE | Freq: Three times a day (TID) | ORAL | 0 refills | Status: AC

## 2022-08-06 NOTE — Discharge Instructions (Signed)
The clinical will contact you with results of the urine culture done today.  Start Keflex 3 times a day for 7 days to treat your UTI Start nystatin antifungal cream twice daily to the affected areas. Please follow-up with your PCP if your symptoms do not improve Please go to the ER for any worsening symptoms

## 2022-08-06 NOTE — ED Triage Notes (Signed)
Pt states that he has pain with urination. X1-2 weeks  Pt states that he was treated for this and now his symptoms are coming back.  Pt states that he also has a rash in different places on his body. X4 months

## 2022-08-06 NOTE — ED Provider Notes (Signed)
[facility]    CSN: [id number] Arrival date & time: 08/06/22  1518      History   Chief Complaint Chief Complaint  Patient presents with   Dysuria    Pain with urination x1-2 weeks   Rash    Rash x4 months    HPI Isaiah Sullivan is a 42 y.o. male presents for dysuria and a rash.  Patient reports on 5/22 he did a virtual visit with [facility] medical care and was prescribed Macrobid for a UTI.  He states his symptoms slightly improved but did not resolve and it began to worsen again.  He finished the course 2 days ago.  He reports in total 1 to 2 weeks of urinary burning without urgency or frequency.  No hematuria, fevers, nausea/vomiting, flank pain.  No testicular pain or swelling or STD exposure or concern.  He does report history of frequent UTIs secondary to his body habitus.  He has taken Azo for his symptoms.  In addition he reports 4 months of a pruritic red scaling rash initially on his groins but is now spread to the folds of his skin of his abdomen.  He has been using clotrimazole OTC with some improvement.  He does state he struggles to keep the area dry and if he does not shower daily he notices it gets worse.  No history of eczema or psoriasis.  No other concerns at this time.   Dysuria Presenting symptoms: dysuria   Rash   Past Medical History:  Diagnosis Date   Closed head injury with concussion    Diabetes mellitus without complication (HCC)    Type II   Head injury, closed, with concussion    Hypertension    PTSD (post-traumatic stress disorder)    PTSD (post-traumatic stress disorder)     There are no problems to display for this patient.   Past Surgical History:  Procedure Laterality Date   CARPAL TUNNEL RELEASE Right 04/13/2016   Procedure: RIGHT CARPAL TUNNEL RELEASE;  Surgeon: Dominica Severin, MD;  Location: [facility] OR;  Service: Orthopedics;  Laterality: Right;   CARPAL TUNNEL RELEASE Left 05/23/2016   Procedure: Left limited open carpal tunnel  release ;  Surgeon: Dominica Severin, MD;  Location: [facility] OR;  Service: Orthopedics;  Laterality: Left;  Requests 45 mins   CHOLECYSTECTOMY     COLONOSCOPY         Home Medications    Prior to Admission medications   Medication Sig Start Date End Date Taking? Authorizing Provider  carvedilol (COREG) 6.25 MG tablet Take by mouth. 10/28/21  Yes [provider]  cephALEXin (KEFLEX) 500 MG capsule Take 1 capsule (500 mg total) by mouth 3 (three) times daily for 7 days. 08/06/22 08/13/22 Yes Radford Pax, NP  ezetimibe (ZETIA) 10 MG tablet Take by mouth. 10/28/21  Yes [provider]  isosorbide mononitrate (IMDUR) 30 MG 24 hr tablet isosorbide mononitrate 30 mg ER 24 hr tabs 04/24/22  Yes [provider]  losartan (COZAAR) 50 MG tablet losartan 50 mg tabs 04/24/22  Yes [provider]  nystatin cream (MYCOSTATIN) Apply to affected area 2 times daily 08/06/22  Yes Radford Pax, NP  pioglitazone (ACTOS) 15 MG tablet pioglitazone 15 mg tabs 04/24/22  Yes [provider]  solifenacin (VESICARE) 10 MG tablet Take by mouth. 05/02/22 05/02/23 Yes [provider]  atorvastatin (LIPITOR) 80 MG tablet Take 80 mg by mouth daily.    [provider]  losartan (COZAAR) 100  MG tablet Take 100 mg by mouth daily.    [provider]  metFORMIN (GLUCOPHAGE) 1000 MG tablet Take 1,000 mg by mouth 2 (two) times daily with a meal.    [provider]  oxyCODONE (OXY IR/ROXICODONE) 5 MG immediate release tablet Take 1 tablet (5 mg total) by mouth every 4 (four) hours as needed (for pain score of 1-4). 05/23/16   Karie Chimera, PA-C  ranitidine (ZANTAC) 150 MG tablet Take 150 mg by mouth 2 (two) times daily as needed for heartburn.     [provider]    Family History History reviewed. No pertinent family history.  Social History Social History   Tobacco Use   Smoking status: Former    Years: 5    Types: Cigarettes   Smokeless  tobacco: Never   Tobacco comments:    quit at age 84  Substance Use Topics   Alcohol use: Not Currently    Alcohol/week: 1.0 standard drink of alcohol    Types: 1 Cans of beer per week   Drug use: No     Allergies   Statins, No known allergies, and Other   Review of Systems Review of Systems  Genitourinary:  Positive for dysuria.  Skin:  Positive for rash.     Physical Exam Triage Vital Signs ED Triage Vitals  Enc Vitals Group     BP 08/06/22 1532 (!) 147/78     Pulse Rate 08/06/22 1532 76     Resp 08/06/22 1532 17     Temp 08/06/22 1532 98.5 F (36.9 C)     Temp Source 08/06/22 1532 Oral     SpO2 08/06/22 1532 94 %     Weight 08/06/22 1526 (!) 531 lb (240.9 kg)     Height 08/06/22 1526 6' (1.829 m)     Head Circumference --      Peak Flow --      Pain Score 08/06/22 1526 0     Pain Loc --      Pain Edu? --      Excl. in GC? --    No data found.  Updated Vital Signs BP (!) 147/78 (BP Location: Right Arm)   Pulse 76   Temp 98.5 F (36.9 C) (Oral)   Resp 17   Ht 6' (1.829 m)   Wt (!) 531 lb (240.9 kg)   SpO2 94%   BMI 72.02 kg/m   Visual Acuity Right Eye Distance:   Left Eye Distance:   Bilateral Distance:    Right Eye Near:   Left Eye Near:    Bilateral Near:     Physical Exam Vitals and nursing note reviewed.  Constitutional:      General: He is not in acute distress.    Appearance: He is obese. He is not ill-appearing, toxic-appearing or diaphoretic.  HENT:     Head: Normocephalic and atraumatic.  Eyes:     Pupils: Pupils are equal, round, and reactive to light.  Cardiovascular:     Rate and Rhythm: Normal rate.  Pulmonary:     Effort: Pulmonary effort is normal.  Abdominal:     Tenderness: There is no right CVA tenderness or left CVA tenderness.  Skin:    General: Skin is warm and dry.     Comments: Patient showed me photos of his groin rash.  Red scaling with central clearing patches noted on the right groin.  There are similar rash  underneath the fold of his right upper chest/breast  tissue  Neurological:     General: No focal deficit present.     Mental Status: He is alert and oriented to person, place, and time.  Psychiatric:        Mood and Affect: Mood normal.        Behavior: Behavior normal.      UC Treatments / Results  Labs (all labs ordered are listed, but only abnormal results are displayed) Labs Reviewed  POCT URINALYSIS DIP (MANUAL ENTRY) - Abnormal; Notable for the following components:      Result Value   Color, UA orange (*)    Glucose, UA =100 (*)    Blood, UA trace-intact (*)    Nitrite, UA Positive (*)    Leukocytes, UA Trace (*)    All other components within normal limits  URINE CULTURE    EKG   Radiology No results found.  Procedures Procedures (including critical care time)  Medications Ordered in UC Medications - No data to display  Initial Impression / Assessment and Plan / UC Course  I have reviewed the triage vital signs and the nursing notes.  Pertinent labs & imaging results that were available during my care of the patient were reviewed by me and considered in my medical decision making (see chart for details).     Reviewed symptoms and exam with patient.  Will send urine culture and start Keflex. Discussed intertrigo, start nystatin cream.  Advised to keep clean and dry Patient to follow-up with PCP if symptoms do not improve ER precautions reviewed and patient verbalized understanding Final Clinical Impressions(s) / UC Diagnoses   Final diagnoses:  Acute cystitis with hematuria  Candidal intertrigo     Discharge Instructions      The clinical will contact you with results of the urine culture done today.  Start Keflex 3 times a day for 7 days to treat your UTI Start nystatin antifungal cream twice daily to the affected areas. Please follow-up with your PCP if your symptoms do not improve Please go to the ER for any worsening symptoms   ED  Prescriptions     Medication Sig Dispense Auth. Provider   nystatin cream (MYCOSTATIN) Apply to affected area 2 times daily 30 g Radford Pax, NP   cephALEXin (KEFLEX) 500 MG capsule Take 1 capsule (500 mg total) by mouth 3 (three) times daily for 7 days. 21 capsule Radford Pax, NP      PDMP not reviewed this encounter.   Radford Pax, NP 08/06/22 [phone]

## 2022-08-07 LAB — URINE CULTURE

## 2022-08-20 ENCOUNTER — Ambulatory Visit
Admission: EM | Admit: 2022-08-20 | Discharge: 2022-08-20 | Disposition: A | Discharge status: Home / Self Care | Payer: Self-pay | Attending: Nurse Practitioner | Admitting: Nurse Practitioner

## 2022-08-20 DIAGNOSIS — N10 Acute tubulo-interstitial nephritis: Secondary | ICD-10-CM

## 2022-08-20 LAB — POCT URINALYSIS DIP (MANUAL ENTRY)
Bilirubin, UA: NEGATIVE
Glucose, UA: NEGATIVE mg/dL
Ketones, POC UA: NEGATIVE mg/dL
Nitrite, UA: POSITIVE — AB
Protein Ur, POC: >=300 mg/dL — AB
Spec Grav, UA: 1.025 (ref 1.010–1.025)
Urobilinogen, UA: 2 E.U./dL — AB
pH, UA: 7 (ref 5.0–8.0)

## 2022-08-20 MED ORDER — CIPROFLOXACIN HCL 500 MG PO TABS: 500.0000 mg | ORAL_TABLET | Freq: Two times a day (BID) | ORAL | 0 refills | Status: AC

## 2022-08-20 MED ORDER — CEFTRIAXONE SODIUM 1 G IJ SOLR
1.0000 g | Freq: Once | INTRAMUSCULAR | Status: AC
  Administered 2022-08-20: 1 g via INTRAMUSCULAR

## 2022-08-20 MED ORDER — ACETAMINOPHEN 325 MG PO TABS
650.0000 mg | ORAL_TABLET | Freq: Once | ORAL | Status: AC
  Administered 2022-08-20: 650 mg via ORAL

## 2022-08-20 NOTE — ED Provider Notes (Signed)
[facility]    CSN: [id number] Arrival date & time: 08/20/22  1511      History   Chief Complaint Chief Complaint  Patient presents with   Flank Pain    HPI Isaiah Sullivan is a 42 y.o. male presents for dysuria.  Patient was seen in urgent care on 6/2 for dysuria.  He was started on cephalexin 3 times daily for 7 days.  Urine culture showed multiple species present in right recollection recommended but patient was not able to do so.  He states he completed the majority of the antibiotic but did not take the last day as he lost the pills.  He states his symptoms seem to have resolved until yesterday when he developed urinary urgency, frequency and burning.  He also reports today he developed a fever.  Does have some back aching.  No nausea or vomiting.  Denies history of pyelonephritis.  He has been taking Azo OTC for symptoms.  No other concerns at this time.   Flank Pain    Past Medical History:  Diagnosis Date   Closed head injury with concussion    Diabetes mellitus without complication (HCC)    Type II   Head injury, closed, with concussion    Hypertension    PTSD (post-traumatic stress disorder)    PTSD (post-traumatic stress disorder)     There are no problems to display for this patient.   Past Surgical History:  Procedure Laterality Date   CARPAL TUNNEL RELEASE Right 04/13/2016   Procedure: RIGHT CARPAL TUNNEL RELEASE;  Surgeon: Dominica Severin, MD;  Location: [facility] OR;  Service: Orthopedics;  Laterality: Right;   CARPAL TUNNEL RELEASE Left 05/23/2016   Procedure: Left limited open carpal tunnel release ;  Surgeon: Dominica Severin, MD;  Location: [facility] OR;  Service: Orthopedics;  Laterality: Left;  Requests 45 mins   CHOLECYSTECTOMY     COLONOSCOPY         Home Medications    Prior to Admission medications   Medication Sig Start Date End Date Taking? Authorizing Provider  ciprofloxacin (CIPRO) 500 MG tablet Take 1 tablet (500 mg total) by mouth 2 (two)  times daily for 7 days. 08/20/22 08/27/22 Yes Radford Pax, NP  atorvastatin (LIPITOR) 80 MG tablet Take 80 mg by mouth daily.    [provider]  carvedilol (COREG) 6.25 MG tablet Take by mouth. 10/28/21   [provider]  ezetimibe (ZETIA) 10 MG tablet Take by mouth. 10/28/21   [provider]  isosorbide mononitrate (IMDUR) 30 MG 24 hr tablet isosorbide mononitrate 30 mg ER 24 hr tabs 04/24/22   [provider]  losartan (COZAAR) 100 MG tablet Take 100 mg by mouth daily.    [provider]  losartan (COZAAR) 50 MG tablet losartan 50 mg tabs 04/24/22   [provider]  metFORMIN (GLUCOPHAGE) 1000 MG tablet Take 1,000 mg by mouth 2 (two) times daily with a meal.    [provider]  nystatin cream (MYCOSTATIN) Apply to affected area 2 times daily 08/06/22   Radford Pax, NP  oxyCODONE (OXY IR/ROXICODONE) 5 MG immediate release tablet Take 1 tablet (5 mg total) by mouth every 4 (four) hours as needed (for pain score of 1-4). 05/23/16   Karie Chimera, PA-C  pioglitazone (ACTOS) 15 MG tablet pioglitazone 15 mg tabs 04/24/22   [provider]  ranitidine (ZANTAC) 150 MG tablet Take 150 mg by mouth 2 (two) times daily as needed for heartburn.  [provider]  solifenacin (VESICARE) 10 MG tablet Take by mouth. 05/02/22 05/02/23  [provider]    Family History History reviewed. No pertinent family history.  Social History Social History   Tobacco Use   Smoking status: Former    Years: 5    Types: Cigarettes   Smokeless tobacco: Never   Tobacco comments:    quit at age 84  Substance Use Topics   Alcohol use: Not Currently    Alcohol/week: 1.0 standard drink of alcohol    Types: 1 Cans of beer per week   Drug use: No     Allergies   Statins, No known allergies, and Other   Review of Systems Review of Systems  Constitutional:  Positive for fever.  Genitourinary:  Positive for dysuria and flank  pain.     Physical Exam Triage Vital Signs ED Triage Vitals  Enc Vitals Group     BP 08/20/22 1519 136/70     Pulse Rate 08/20/22 1519 83     Resp 08/20/22 1519 18     Temp 08/20/22 1519 (!) 100.9 F (38.3 C)     Temp Source 08/20/22 1519 Oral     SpO2 08/20/22 1519 94 %     Weight --      Height --      Head Circumference --      Peak Flow --      Pain Score 08/20/22 1518 7     Pain Loc --      Pain Edu? --      Excl. in GC? --    No data found.  Updated Vital Signs BP 136/70 (BP Location: Right Arm)   Pulse 83   Temp (!) 100.9 F (38.3 C) (Oral)   Resp 18   SpO2 94%   Visual Acuity Right Eye Distance:   Left Eye Distance:   Bilateral Distance:    Right Eye Near:   Left Eye Near:    Bilateral Near:     Physical Exam Vitals and nursing note reviewed.  Constitutional:      General: He is not in acute distress.    Appearance: He is obese. He is not ill-appearing, toxic-appearing or diaphoretic.  HENT:     Head: Normocephalic and atraumatic.  Eyes:     Pupils: Pupils are equal, round, and reactive to light.  Cardiovascular:     Rate and Rhythm: Normal rate.  Pulmonary:     Effort: Pulmonary effort is normal.  Abdominal:     Tenderness: There is no right CVA tenderness or left CVA tenderness.     Comments: No CVAT bilaterally but feel body habitus somewhat limits exam  Skin:    General: Skin is warm and dry.  Neurological:     General: No focal deficit present.     Mental Status: He is alert and oriented to person, place, and time.  Psychiatric:        Mood and Affect: Mood normal.        Behavior: Behavior normal.      UC Treatments / Results  Labs (all labs ordered are listed, but only abnormal results are displayed) Labs Reviewed  POCT URINALYSIS DIP (MANUAL ENTRY) - Abnormal; Notable for the following components:      Result Value   Clarity, UA cloudy (*)    Blood, UA large (*)    Protein Ur, POC >=300 (*)    Urobilinogen, UA 2.0 (*)     Nitrite,  UA Positive (*)    Leukocytes, UA Small (1+) (*)    All other components within normal limits  URINE CULTURE    EKG   Radiology No results found.  Procedures Procedures (including critical care time)  Medications Ordered in UC Medications  cefTRIAXone (ROCEPHIN) injection 1 g (1 g Intramuscular Given 08/20/22 1546)  acetaminophen (TYLENOL) tablet 650 mg (650 mg Oral Given 08/20/22 1546)    Initial Impression / Assessment and Plan / UC Course  I have reviewed the triage vital signs and the nursing notes.  Pertinent labs & imaging results that were available during my care of the patient were reviewed by me and considered in my medical decision making (see chart for details).     Reviewed symptoms and exam with patient.  Patient did take Azo and UA still remains positive.  With fever and back pain we will treat for pyelonephritis.  He was given 1 g ceftriaxone in clinic.  Monitored for 10 minutes after injection with no reaction noted and tolerated.  Patient is febrile in clinic but heart rate and blood pressure remain normal.  Will send urine for culture.  Will start Cipro, side effect profile reviewed Advised to remain hydrated Discussed with patient that if he is not having improvement in symptoms within 24 hours and/or he develops worsening symptoms, red flags reviewed, he is to go to the emergency room ASAP for further treatment and he verbalized understanding Patient was discharged in no acute distress and nontoxic in appearance Final Clinical Impressions(s) / UC Diagnoses   Final diagnoses:  Acute pyelonephritis     Discharge Instructions      Start Cipro twice daily for 7 days.  The clinic will contact you with results of the urine culture done today.  Remain hydrated.  You may take Tylenol as needed for fever.  Please go to the ER ASAP if your symptoms do not improve within the next 24 hours and/or they worsen.  Please follow-up with your PCP in 1 to 2 days for  recheck     ED Prescriptions     Medication Sig Dispense Auth. Provider   ciprofloxacin (CIPRO) 500 MG tablet Take 1 tablet (500 mg total) by mouth 2 (two) times daily for 7 days. 14 tablet Radford Pax, NP      PDMP not reviewed this encounter.   Radford Pax, NP 08/20/22 1555

## 2022-08-20 NOTE — ED Triage Notes (Signed)
Pt presents with c/o recurrent urinary frequency, back soreness, fever, aches and dysuria.   States he did not complete the antibiotics given because he lost the last two.

## 2022-08-20 NOTE — Discharge Instructions (Addendum)
Start Cipro twice daily for 7 days.  The clinic will contact you with results of the urine culture done today.  Remain hydrated.  You may take Tylenol as needed for fever.  Please go to the ER ASAP if your symptoms do not improve within the next 24 hours and/or they worsen.  Please follow-up with your PCP in 1 to 2 days for recheck

## 2022-08-22 LAB — URINE CULTURE: Culture: 60000 — AB

## 2022-09-11 ENCOUNTER — Ambulatory Visit
Admission: EM | Admit: 2022-09-11 | Discharge: 2022-09-11 | Disposition: A | Discharge status: Home / Self Care | Payer: Self-pay | Attending: Internal Medicine | Admitting: Internal Medicine

## 2022-09-11 DIAGNOSIS — Z711 Person with feared health complaint in whom no diagnosis is made: Secondary | ICD-10-CM

## 2022-09-11 DIAGNOSIS — R300 Dysuria: Secondary | ICD-10-CM

## 2022-09-11 LAB — POCT URINALYSIS DIP (MANUAL ENTRY)
Bilirubin, UA: NEGATIVE
Blood, UA: NEGATIVE
Glucose, UA: NEGATIVE mg/dL
Ketones, POC UA: NEGATIVE mg/dL
Leukocytes, UA: NEGATIVE
Nitrite, UA: NEGATIVE
Protein Ur, POC: NEGATIVE mg/dL
Spec Grav, UA: >=1.03 — AB (ref 1.010–1.025)
Urobilinogen, UA: 0.2 E.U./dL
pH, UA: 5.5 (ref 5.0–8.0)

## 2022-09-11 NOTE — ED Provider Notes (Signed)
[facility]    CSN: [id number] Arrival date & time: 09/11/22  1447      History   Chief Complaint No chief complaint on file.   HPI Isaiah Sullivan is a 42 y.o. male [place] for dysuria.  Patient reports 3 to 4 days of urinary burning without urgency or frequency.  Denies hematuria, fevers, nausea/vomiting, flank pain.  No STD exposure or concern, no penile discharge, no testicular pain or swelling.  Patient has been seen twice in urgent care in June for dysuria.  His first visit was on June 2 where he was treated with Keflex.   His urine culture from June 2 showed multiple species and recommended recollection but that was not done.  He returned on June 16 and stated symptoms had resolved but then returned.  He was treated for potential pyelonephritis given his presentation with ceftriaxone and Cipro.  Urine culture did show E. coli with sensitivity to Cipro.  He states he symptoms completely resolved.  He does have a history of overactive bladder and is following with urology.  In addition he reports yesterday he stepped on some glass to his right foot and just wants to make sure there is no retained glass.  States it only bled a single drop.  No other concerns at this time.  HPI  Past Medical History:  Diagnosis Date   Closed head injury with concussion    Diabetes mellitus without complication (HCC)    Type II   Head injury, closed, with concussion    Hypertension    PTSD (post-traumatic stress disorder)    PTSD (post-traumatic stress disorder)     There are no problems to display for this patient.   Past Surgical History:  Procedure Laterality Date   CARPAL TUNNEL RELEASE Right 04/13/2016   Procedure: RIGHT CARPAL TUNNEL RELEASE;  Surgeon: Dominica Severin, MD;  Location: [facility] OR;  Service: Orthopedics;  Laterality: Right;   CARPAL TUNNEL RELEASE Left 05/23/2016   Procedure: Left limited open carpal tunnel release ;  Surgeon: Dominica Severin, MD;  Location: [facility] OR;  Service:  Orthopedics;  Laterality: Left;  Requests 45 mins   CHOLECYSTECTOMY     COLONOSCOPY         Home Medications    Prior to Admission medications   Medication Sig Start Date End Date Taking? Authorizing Provider  atorvastatin (LIPITOR) 80 MG tablet Take 80 mg by mouth daily.    [provider]  carvedilol (COREG) 6.25 MG tablet Take by mouth. 10/28/21   [provider]  ezetimibe (ZETIA) 10 MG tablet Take by mouth. 10/28/21   [provider]  isosorbide mononitrate (IMDUR) 30 MG 24 hr tablet isosorbide mononitrate 30 mg ER 24 hr tabs 04/24/22   [provider]  losartan (COZAAR) 100 MG tablet Take 100 mg by mouth daily.    [provider]  losartan (COZAAR) 50 MG tablet losartan 50 mg tabs 04/24/22   [provider]  metFORMIN (GLUCOPHAGE) 1000 MG tablet Take 1,000 mg by mouth 2 (two) times daily with a meal.    [provider]  nystatin cream (MYCOSTATIN) Apply to affected area 2 times daily 08/06/22   Radford Pax, NP  oxyCODONE (OXY IR/ROXICODONE) 5 MG immediate release tablet Take 1 tablet (5 mg total) by mouth every 4 (four) hours as needed (for pain score of 1-4). 05/23/16   Karie Chimera, PA-C  pioglitazone (ACTOS) 15 MG tablet pioglitazone 15 mg tabs 04/24/22   [provider]  ranitidine (ZANTAC) 150 MG tablet Take 150 mg by mouth 2 (two) times daily as needed for heartburn.     [provider]  solifenacin (VESICARE) 10 MG tablet Take by mouth. 05/02/22 05/02/23  [provider]    Family History No family history on file.  Social History Social History   Tobacco Use   Smoking status: Former    Years: 5    Types: Cigarettes   Smokeless tobacco: Never   Tobacco comments:    quit at age 68  Substance Use Topics   Alcohol use: Not Currently    Alcohol/week: 1.0 standard drink of alcohol    Types: 1 Cans of beer per week   Drug use: No     Allergies   Statins, No known allergies, and  Other   Review of Systems Review of Systems  Genitourinary:  Positive for dysuria.  Skin:        Possible glass in foot     Physical Exam Triage Vital Signs ED Triage Vitals  Enc Vitals Group     BP 09/11/22 1457 (!) 167/77     Pulse Rate 09/11/22 1457 78     Resp 09/11/22 1457 17     Temp 09/11/22 1457 99.1 F (37.3 C)     Temp Source 09/11/22 1457 Oral     SpO2 09/11/22 1457 95 %     Weight --      Height --      Head Circumference --      Peak Flow --      Pain Score 09/11/22 1456 3     Pain Loc --      Pain Edu? --      Excl. in GC? --    No data found.  Updated Vital Signs BP (!) 167/77 (BP Location: Right Arm)   Pulse 78   Temp 99.1 F (37.3 C) (Oral)   Resp 17   SpO2 95%   Visual Acuity Right Eye Distance:   Left Eye Distance:   Bilateral Distance:    Right Eye Near:   Left Eye Near:    Bilateral Near:     Physical Exam Vitals and nursing note reviewed.  Constitutional:      General: He is not in acute distress.    Appearance: Normal appearance. He is not ill-appearing.  HENT:     Head: Normocephalic and atraumatic.  Eyes:     Pupils: Pupils are equal, round, and reactive to light.  Cardiovascular:     Rate and Rhythm: Normal rate.  Pulmonary:     Effort: Pulmonary effort is normal.  Abdominal:     Tenderness: There is no right CVA tenderness or left CVA tenderness.  Skin:    General: Skin is warm and dry.     Comments: There is no visible puncture wound of the right foot.  There is no swelling, erythema, warmth.  No palpable foreign body to the foot.  Neurological:     General: No focal deficit present.     Mental Status: He is alert and oriented to person, place, and time.  Psychiatric:        Mood and Affect: Mood normal.        Behavior: Behavior normal.      UC Treatments / Results  Labs (all labs ordered are listed, but only abnormal results are displayed) Labs Reviewed  POCT URINALYSIS DIP (MANUAL ENTRY) - Abnormal;  Notable for the following components:  Result Value   Spec Grav, UA >=1.030 (*)    All other components within normal limits  URINE CULTURE    EKG   Radiology No results found.  Procedures Procedures (including critical care time)  Medications Ordered in UC Medications - No data to display  Initial Impression / Assessment and Plan / UC Course  I have reviewed the triage vital signs and the nursing notes.  Pertinent labs & imaging results that were available during my care of the patient were reviewed by me and considered in my medical decision making (see chart for details).     Reviewed exam and symptoms with patient.  No red flags.  UA unremarkable.  Will culture given symptoms and recent UTI.  Advised to increase fluids.  No visible or palpable foreign body to the right foot on exam.  Advised to monitor for any signs of infection.  Patient to follow-up with his urologist as if symptoms do not improve.  ER precautions reviewed and patient verbalized understanding Final Clinical Impressions(s) / UC Diagnoses   Final diagnoses:  Dysuria  Feared condition not demonstrated     Discharge Instructions      The clinic will contact you with the results of your urine culture if positive.  Increase your fluids.  Please follow-up with urologist if your symptoms continue.    ED Prescriptions   None    PDMP not reviewed this encounter.   Radford Pax, NP 09/11/22 [phone]

## 2022-09-11 NOTE — ED Triage Notes (Signed)
Pt presents with c/o possible UTI and states he might have a piece of glass on the right foot.   C/O mild burning when voiding.

## 2022-09-11 NOTE — Discharge Instructions (Addendum)
The clinic will contact you with the results of your urine culture if positive.  Increase your fluids.  Please follow-up with urologist if your symptoms continue.

## 2022-09-11 NOTE — ED Triage Notes (Signed)
Pt states he had diarrhea this morning.

## 2022-09-12 LAB — URINE CULTURE: Culture: NO GROWTH

## 2023-07-28 ENCOUNTER — Ambulatory Visit (INDEPENDENT_AMBULATORY_CARE_PROVIDER_SITE_OTHER)

## 2023-07-28 ENCOUNTER — Ambulatory Visit (HOSPITAL_COMMUNITY): Admission: EM | Admit: 2023-07-28 | Discharge: 2023-07-28 | Disposition: A | Discharge status: Home / Self Care

## 2023-07-28 ENCOUNTER — Other Ambulatory Visit: Payer: Self-pay

## 2023-07-28 ENCOUNTER — Encounter (HOSPITAL_COMMUNITY): Payer: Self-pay

## 2023-07-28 DIAGNOSIS — J189 Pneumonia, unspecified organism: Secondary | ICD-10-CM

## 2023-07-28 DIAGNOSIS — R053 Chronic cough: Secondary | ICD-10-CM

## 2023-07-28 MED ORDER — PREDNISONE 20 MG PO TABS: 40.0000 mg | ORAL_TABLET | Freq: Every day | ORAL | 0 refills | Status: AC

## 2023-07-28 MED ORDER — DOXYCYCLINE HYCLATE 100 MG PO CAPS: 100.0000 mg | ORAL_CAPSULE | Freq: Two times a day (BID) | ORAL | 0 refills | Status: AC

## 2023-07-28 MED ORDER — IPRATROPIUM-ALBUTEROL 0.5-2.5 (3) MG/3ML IN SOLN
3.0000 mL | Freq: Once | RESPIRATORY_TRACT | Status: AC
  Administered 2023-07-28: 3 mL via RESPIRATORY_TRACT

## 2023-07-28 MED ORDER — IPRATROPIUM-ALBUTEROL 0.5-2.5 (3) MG/3ML IN SOLN
RESPIRATORY_TRACT | Status: AC
  Filled 2023-07-28: qty 3

## 2023-07-28 MED ORDER — PROMETHAZINE-DM 6.25-15 MG/5ML PO SYRP: 5.0000 mL | ORAL_SOLUTION | Freq: Four times a day (QID) | ORAL | 0 refills | Status: AC | PRN

## 2023-07-28 MED ORDER — ALBUTEROL SULFATE HFA 108 (90 BASE) MCG/ACT IN AERS: 1.0000 | INHALATION_SPRAY | Freq: Four times a day (QID) | RESPIRATORY_TRACT | 0 refills | Status: AC | PRN

## 2023-07-28 NOTE — ED Triage Notes (Signed)
 Pt presents with complaints of non-productive cough, wheezing, shortness of breath, fatigue, fevers, nasal congestion, and generalized body aches x 4 days. Pt states he is concerned about his breathing. States he feels like he cannot catch his breath. Pt does state he has been taking oxycodone  for his shoulder pain, noticed more wheezing with this medication.

## 2023-07-28 NOTE — Discharge Instructions (Addendum)
  1. Community acquired pneumonia, unspecified laterality (Primary) - DG Chest 2 View x-ray performed in UC appears to show some focal consolidation to bilateral lower lung, specifically right lower lobe consolidation consistent with pneumonia. - ipratropium-albuterol (DUONEB) 0.5-2.5 (3) MG/3ML nebulizer solution 3 mL given in UC for acute wheezing and difficulty breathing - ED EKG performed in UC shows normal sinus rhythm, possible pulmonary disease pattern, ventricular rate of 84 BPM, no STEMI. - doxycycline (VIBRAMYCIN) 100 MG capsule; Take 1 capsule (100 mg total) by mouth 2 (two) times daily for 7 days.  Dispense: 14 capsule; Refill: 0 - predniSONE (DELTASONE) 20 MG tablet; Take 2 tablets (40 mg total) by mouth daily for 5 days.  Dispense: 10 tablet; Refill: 0 - albuterol (VENTOLIN HFA) 108 (90 Base) MCG/ACT inhaler; Inhale 1-2 puffs into the lungs every 6 (six) hours as needed for wheezing or shortness of breath.  Dispense: 6.7 g; Refill: 0 - promethazine-dextromethorphan (PROMETHAZINE-DM) 6.25-15 MG/5ML syrup; Take 5 mLs by mouth 4 (four) times daily as needed for cough.  Dispense: 118 mL; Refill: 0 -Continue to monitor symptoms for any change in severity if there is any escalation of current symptoms or development of new symptoms follow-up in ER for further evaluation and management.

## 2023-07-28 NOTE — ED Provider Notes (Signed)
 [facility]  Note:  This document was prepared using Dragon voice recognition software and may include unintentional dictation errors.  MRN: [id number] DOB: May 02, 1980  Subjective:   Isaiah Sullivan is a 43 y.o. male presenting for persistent cough x 4 days, patient also complaining of wheezing, fatigue, fever, shortness of breath, nasal congestion and generalized bodyaches.  Patient is mostly concerned about his breathing stating that is very difficult for him to catch his breath.  Patient reports he takes oxycodone  for chronic shoulder injury and states that he noticed more wheezing after taking medication.  Patient reports that his daughter also has similar symptoms and her symptoms started about 3 to 4 days prior to the onset of his symptoms.  Patient denies any history of asthma or COPD.  No current facility-administered medications for this encounter.  Current Outpatient Medications:    albuterol (VENTOLIN HFA) 108 (90 Base) MCG/ACT inhaler, Inhale 1-2 puffs into the lungs every 6 (six) hours as needed for wheezing or shortness of breath., Disp: 6.7 g, Rfl: 0   doxycycline (VIBRAMYCIN) 100 MG capsule, Take 1 capsule (100 mg total) by mouth 2 (two) times daily for 7 days., Disp: 14 capsule, Rfl: 0   predniSONE (DELTASONE) 20 MG tablet, Take 2 tablets (40 mg total) by mouth daily for 5 days., Disp: 10 tablet, Rfl: 0   promethazine-dextromethorphan (PROMETHAZINE-DM) 6.25-15 MG/5ML syrup, Take 5 mLs by mouth 4 (four) times daily as needed for cough., Disp: 118 mL, Rfl: 0   atorvastatin (LIPITOR) 80 MG tablet, Take 80 mg by mouth daily., Disp: , Rfl:    carvedilol (COREG) 6.25 MG tablet, Take by mouth., Disp: , Rfl:    ezetimibe (ZETIA) 10 MG tablet, Take by mouth., Disp: , Rfl:    isosorbide mononitrate (IMDUR) 30 MG 24 hr tablet, isosorbide mononitrate 30 mg ER 24 hr tabs, Disp: , Rfl:    losartan (COZAAR) 100 MG tablet, Take 100 mg by mouth daily., Disp: , Rfl:    losartan  (COZAAR) 50 MG tablet, losartan 50 mg tabs, Disp: , Rfl:    metFORMIN (GLUCOPHAGE) 1000 MG tablet, Take 1,000 mg by mouth 2 (two) times daily with a meal., Disp: , Rfl:    nystatin  cream (MYCOSTATIN ), Apply to affected area 2 times daily, Disp: 30 g, Rfl: 1   oxyCODONE  (OXY IR/ROXICODONE ) 5 MG immediate release tablet, Take 1 tablet (5 mg total) by mouth every 4 (four) hours as needed (for pain score of 1-4)., Disp: 40 tablet, Rfl: 0   pioglitazone (ACTOS) 15 MG tablet, pioglitazone 15 mg tabs, Disp: , Rfl:    ranitidine (ZANTAC) 150 MG tablet, Take 150 mg by mouth 2 (two) times daily as needed for heartburn. , Disp: , Rfl:    Allergies  Allergen Reactions   Statins Other (See Comments)    Weakness   No Known Allergies    Other     NO BLOOD PRODUCTS    Past Medical History:  Diagnosis Date   Closed head injury with concussion    Diabetes mellitus without complication (HCC)    Type II   Head injury, closed, with concussion    Hypertension    PTSD (post-traumatic stress disorder)    PTSD (post-traumatic stress disorder)      Past Surgical History:  Procedure Laterality Date   CARPAL TUNNEL RELEASE Right 04/13/2016   Procedure: RIGHT CARPAL TUNNEL RELEASE;  Surgeon: Ronn Cohn, MD;  Location: [facility] OR;  Service: Orthopedics;  Laterality: Right;   CARPAL TUNNEL  RELEASE Left 05/23/2016   Procedure: Left limited open carpal tunnel release ;  Surgeon: Ronn Cohn, MD;  [facility];  Service: Orthopedics;  Laterality: Left;  Requests 45 mins   CHOLECYSTECTOMY     COLONOSCOPY      History reviewed. No pertinent family history.  Social History   Tobacco Use   Smoking status: Former    Types: Cigarettes   Smokeless tobacco: Never   Tobacco comments:    quit at age 54  Vaping Use   Vaping status: Never Used  Substance Use Topics   Alcohol use: Not Currently    Alcohol/week: 1.0 standard drink of alcohol    Types: 1 Cans of beer per week   Drug use: No    ROS Refer to  HPI for ROS details.  Objective:   Vitals: BP 135/72 (BP Location: Right Arm)   Pulse 81   Temp 99.5 F (37.5 C) (Oral)   Resp (!) 24   Ht 6' (1.829 m)   Wt (!) 531 lb 1.4 oz (240.9 kg)   SpO2 90%   BMI 72.03 kg/m   Physical Exam Vitals and nursing note reviewed.  Constitutional:      General: He is not in acute distress.    Appearance: Normal appearance. He is well-developed. He is not ill-appearing, toxic-appearing or diaphoretic.  HENT:     Head: Normocephalic and atraumatic.     Nose: Congestion and rhinorrhea present.     Mouth/Throat:     Mouth: Mucous membranes are moist.  Eyes:     General:        Right eye: No discharge.        Left eye: No discharge.     Extraocular Movements: Extraocular movements intact.     Conjunctiva/sclera: Conjunctivae normal.  Cardiovascular:     Rate and Rhythm: Normal rate and regular rhythm.     Heart sounds: No murmur heard. Pulmonary:     Effort: Pulmonary effort is normal. No respiratory distress.     Breath sounds: No stridor. Wheezing present. No rhonchi or rales.  Chest:     Chest wall: No tenderness.  Musculoskeletal:     Cervical back: Normal range of motion and neck supple. No rigidity or tenderness.  Skin:    General: Skin is warm and dry.  Neurological:     General: No focal deficit present.     Mental Status: He is alert and oriented to person, place, and time.  Psychiatric:        Mood and Affect: Mood normal.        Behavior: Behavior normal.     Procedures  No results found for this or any previous visit (from the past 24 hours).  DG Chest 2 View Result Date: 07/28/2023 CLINICAL DATA:  Persistent cough EXAM: CHEST - 2 VIEW COMPARISON:  04/26/2020 FINDINGS: Frontal and lateral views of the chest demonstrate an unremarkable cardiac silhouette. Right upper lobe airspace disease consistent with pneumonia. No effusion or pneumothorax. No acute bony abnormalities. IMPRESSION: 1. Right upper lobe airspace disease  consistent with pneumonia. Electronically Signed   By: Bobbye Burrow M.D.   On: 07/28/2023 17:55     Assessment and Plan :     Discharge Instructions       1. Community acquired pneumonia, unspecified laterality (Primary) - DG Chest 2 View x-ray performed in UC appears to show some focal consolidation to bilateral lower lung, specifically right lower lobe consolidation consistent with pneumonia. - ipratropium-albuterol (  DUONEB) 0.5-2.5 (3) MG/3ML nebulizer solution 3 mL given in UC for acute wheezing and difficulty breathing - ED EKG performed in UC shows normal sinus rhythm, possible pulmonary disease pattern, ventricular rate of 84 BPM, no STEMI. - doxycycline (VIBRAMYCIN) 100 MG capsule; Take 1 capsule (100 mg total) by mouth 2 (two) times daily for 7 days.  Dispense: 14 capsule; Refill: 0 - predniSONE (DELTASONE) 20 MG tablet; Take 2 tablets (40 mg total) by mouth daily for 5 days.  Dispense: 10 tablet; Refill: 0 - albuterol (VENTOLIN HFA) 108 (90 Base) MCG/ACT inhaler; Inhale 1-2 puffs into the lungs every 6 (six) hours as needed for wheezing or shortness of breath.  Dispense: 6.7 g; Refill: 0 - promethazine-dextromethorphan (PROMETHAZINE-DM) 6.25-15 MG/5ML syrup; Take 5 mLs by mouth 4 (four) times daily as needed for cough.  Dispense: 118 mL; Refill: 0 -Continue to monitor symptoms for any change in severity if there is any escalation of current symptoms or development of new symptoms follow-up in ER for further evaluation and management.    Vaughn Frieze B Kabria Hetzer   [place], [place], [place] 07/28/23 [phone]

## 2023-09-26 ENCOUNTER — Ambulatory Visit
Admission: EM | Admit: 2023-09-26 | Discharge: 2023-09-26 | Disposition: A | Discharge status: Home / Self Care | Payer: Self-pay | Attending: Family Medicine | Admitting: Family Medicine

## 2023-09-26 DIAGNOSIS — N3000 Acute cystitis without hematuria: Secondary | ICD-10-CM

## 2023-09-26 LAB — POCT URINALYSIS DIP (MANUAL ENTRY)
Bilirubin, UA: NEGATIVE
Blood, UA: NEGATIVE
Glucose, UA: NEGATIVE mg/dL
Ketones, POC UA: NEGATIVE mg/dL
Nitrite, UA: NEGATIVE
Protein Ur, POC: NEGATIVE mg/dL
Spec Grav, UA: 1.02 (ref 1.010–1.025)
Urobilinogen, UA: 0.2 U/dL
pH, UA: 7 (ref 5.0–8.0)

## 2023-09-26 MED ORDER — CEPHALEXIN 500 MG PO CAPS: 500.0000 mg | ORAL_CAPSULE | Freq: Three times a day (TID) | ORAL | 0 refills | Status: AC

## 2023-09-26 NOTE — ED Provider Notes (Signed)
 [facility]  Note:  This document was prepared using Conservation officer, historic buildings and may include unintentional dictation errors.  MRN: [id number] DOB: 08-02-1980  Subjective:   Isaiah Sullivan is a 43 y.o. male presenting for 3-day history of dysuria now having urinary incontinence today.  Has previously been prescribed medication for an overactive bladder.  Does not feel that it is working any longer.  Has previously been suspected of having a urinary stone but was never confirmed due to cost burden of imaging.  Patient tries to hydrate with water regularly and daily.  However, he also drinks artificial sweetener drink daily.  Denies hematuria, urinary frequency, penile discharge, penile swelling, testicular pain, testicular swelling, anal pain, groin pain.  No concern for sexually transmitted infection.  No current facility-administered medications for this encounter.  Current Outpatient Medications:    albuterol  (VENTOLIN  HFA) 108 (90 Base) MCG/ACT inhaler, Inhale 1-2 puffs into the lungs every 6 (six) hours as needed for wheezing or shortness of breath., Disp: 6.7 g, Rfl: 0   atorvastatin (LIPITOR) 80 MG tablet, Take 80 mg by mouth daily., Disp: , Rfl:    carvedilol (COREG) 6.25 MG tablet, Take by mouth., Disp: , Rfl:    ezetimibe (ZETIA) 10 MG tablet, Take by mouth., Disp: , Rfl:    isosorbide mononitrate (IMDUR) 30 MG 24 hr tablet, isosorbide mononitrate 30 mg ER 24 hr tabs, Disp: , Rfl:    losartan (COZAAR) 100 MG tablet, Take 100 mg by mouth daily., Disp: , Rfl:    losartan (COZAAR) 50 MG tablet, losartan 50 mg tabs, Disp: , Rfl:    metFORMIN (GLUCOPHAGE) 1000 MG tablet, Take 1,000 mg by mouth 2 (two) times daily with a meal., Disp: , Rfl:    nystatin  cream (MYCOSTATIN ), Apply to affected area 2 times daily, Disp: 30 g, Rfl: 1   oxyCODONE  (OXY IR/ROXICODONE ) 5 MG immediate release tablet, Take 1 tablet (5 mg total) by mouth every 4 (four) hours as needed  (for pain score of 1-4)., Disp: 40 tablet, Rfl: 0   pioglitazone (ACTOS) 15 MG tablet, pioglitazone 15 mg tabs, Disp: , Rfl:    promethazine -dextromethorphan (PROMETHAZINE -DM) 6.25-15 MG/5ML syrup, Take 5 mLs by mouth 4 (four) times daily as needed for cough., Disp: 118 mL, Rfl: 0   ranitidine (ZANTAC) 150 MG tablet, Take 150 mg by mouth 2 (two) times daily as needed for heartburn. , Disp: , Rfl:    Allergies  Allergen Reactions   Statins Other (See Comments)    Weakness   No Known Allergies    Other     NO BLOOD PRODUCTS    Past Medical History:  Diagnosis Date   Closed head injury with concussion    Diabetes mellitus without complication (HCC)    Type II   Head injury, closed, with concussion    Hypertension    PTSD (post-traumatic stress disorder)    PTSD (post-traumatic stress disorder)      Past Surgical History:  Procedure Laterality Date   CARPAL TUNNEL RELEASE Right 04/13/2016   Procedure: RIGHT CARPAL TUNNEL RELEASE;  Surgeon: Elsie Mussel, MD;  Location: [facility] OR;  Service: Orthopedics;  Laterality: Right;   CARPAL TUNNEL RELEASE Left 05/23/2016   Procedure: Left limited open carpal tunnel release ;  Surgeon: Elsie Mussel, MD;  Location: [facility] OR;  Service: Orthopedics;  Laterality: Left;  Requests 45 mins   CHOLECYSTECTOMY     COLONOSCOPY      No family history on file.  Social History   Tobacco Use   Smoking status: Former    Types: Cigarettes   Smokeless tobacco: Never   Tobacco comments:    quit at age 69  Vaping Use   Vaping status: Never Used  Substance Use Topics   Alcohol use: Not Currently   Drug use: No    ROS   Objective:   Vitals: BP (!) 168/82 (BP Location: Left Wrist)   Pulse 79   Temp 98.6 F (37 C) (Oral)   Resp (!) 24   SpO2 95%   Physical Exam Constitutional:      General: He is not in acute distress.    Appearance: Normal appearance. He is well-developed and normal weight. He is not ill-appearing, toxic-appearing or  diaphoretic.  HENT:     Head: Normocephalic and atraumatic.     Right Ear: External ear normal.     Left Ear: External ear normal.     Nose: Nose normal.     Mouth/Throat:     Pharynx: Oropharynx is clear.  Eyes:     General: No scleral icterus.       Right eye: No discharge.        Left eye: No discharge.     Extraocular Movements: Extraocular movements intact.  Cardiovascular:     Rate and Rhythm: Normal rate.  Pulmonary:     Effort: Pulmonary effort is normal.  Musculoskeletal:     Cervical back: Normal range of motion.  Neurological:     Mental Status: He is alert and oriented to person, place, and time.  Psychiatric:        Mood and Affect: Mood normal.        Behavior: Behavior normal.        Thought Content: Thought content normal.        Judgment: Judgment normal.     Results for orders placed or performed during the hospital encounter of 09/26/23 (from the past 24 hours)  POCT urinalysis dipstick     Status: Abnormal   Collection Time: 09/26/23  9:34 AM  Result Value Ref Range   Color, UA yellow yellow   Clarity, UA hazy (A) clear   Glucose, UA negative negative mg/dL   Bilirubin, UA negative negative   Ketones, POC UA negative negative mg/dL   Spec Grav, UA 8.979 8.989 - 1.025   Blood, UA negative negative   pH, UA 7.0 5.0 - 8.0   Protein Ur, POC negative negative mg/dL   Urobilinogen, UA 0.2 0.2 or 1.0 E.U./dL   Nitrite, UA Negative Negative   Leukocytes, UA Trace (A) Negative    Assessment and Plan :   PDMP not reviewed this encounter.  1. Acute cystitis without hematuria    Start cephalexin  to cover for acute cystitis, urine culture pending.  Recommended aggressive hydration, limiting urinary irritants. Counseled patient on potential for adverse effects with medications prescribed/recommended today, ER and return-to-clinic precautions discussed, patient verbalized understanding.    Christopher Savannah, [place] 09/26/23 [id number]

## 2023-09-26 NOTE — ED Triage Notes (Signed)
 Pt c/o pain to end of penis when he starts to void x 3 days-was incontinent of urine x today-states he takes meds for overactive bladder-NAD-steady gait

## 2023-09-26 NOTE — Discharge Instructions (Addendum)

## 2024-01-18 ENCOUNTER — Other Ambulatory Visit: Payer: Self-pay

## 2024-01-18 ENCOUNTER — Ambulatory Visit
Admission: EM | Admit: 2024-01-18 | Discharge: 2024-01-18 | Disposition: A | Discharge status: Home / Self Care | Payer: Self-pay

## 2024-01-18 DIAGNOSIS — K648 Other hemorrhoids: Secondary | ICD-10-CM

## 2024-01-18 DIAGNOSIS — R42 Dizziness and giddiness: Secondary | ICD-10-CM

## 2024-01-18 DIAGNOSIS — K625 Hemorrhage of anus and rectum: Secondary | ICD-10-CM

## 2024-01-18 HISTORY — DX: Overactive bladder: N32.81

## 2024-01-18 MED ORDER — HYDROCORTISONE (PERIANAL) 2.5 % EX CREA: 1.0000 | TOPICAL_CREAM | Freq: Two times a day (BID) | CUTANEOUS | 0 refills | Status: AC

## 2024-01-18 NOTE — Discharge Instructions (Addendum)
 Symptoms and physical exam findings are both consistent with an internal hemorrhoid.  On physical exam there is a small right sided hemorrhoid that protrudes under pressure but spontaneously retracts.  I do not see any active bleeding at this time.  We will treat this with hydrocortisone suppositories and recommend follow-up with gastroenterology.  Given the bleeding we will also order blood work including a complete blood count, thyroid stimulating hormone and complete metabolic panel.  These results will take approximately 24 to 48 hours to finalize.  This can also help with evaluating the dizziness that has been present for 2 months to see if there is any abnormalities that might be contributing to this.  If there are any abnormalities that require further intervention we will contact you otherwise your results will be available on your MyChart.  Recommend following up with primary care for this as well however the symptoms do seem to be consistent with some type of positional vertigo.  This could also be evaluated by an ENT provider.  I have attached the information regarding this.  If you develop severe bleeding per rectum, persistent bleeding per rectum, severe pain or worsening dizziness then recommend going to the emergency room for further evaluation.  Can return to urgent care as needed.  The following medications have been called into your pharmacy: Hydrocortisone rectal cream 1 application rectally twice daily for 7 days.

## 2024-01-18 NOTE — ED Provider Notes (Signed)
 [facility]    CSN: [id number] Arrival date & time: 01/18/24  1316      History   Chief Complaint No chief complaint on file.   HPI Isaiah Sullivan is a 43 y.o. male.   43 year old male presents urgent care with complaints of blood per rectum, mucus per rectum and diarrhea.  He reports that yesterday he ate a pizza from [employer] with his family.  There was a pizza that he ate that no one else ate.  Very shortly after he developed abdominal cramping followed by significant diarrhea.  He had several episodes of diarrhea and then began to notice blood per rectum.  He has continued to have some diarrhea through the night into today with blood per rectum.  The blood is not actively coming out but is whenever he has diarrhea or a bowel movement.  He does have a history when he was in his early 53s of need to have a colonoscopy secondary to rectal bleeding after having bowel movements.  He reports that he was told they did not find anything at that time.  He has not had any nausea or vomiting.  He also relates that he has been having some dizziness symptoms that come and go for about 2 months.  It did go away for short period of time but came back.  The dizziness does seem to be related with turning his head in certain positions rapidly.  He was concerned it might be one of his medications but was unsure.  He has not followed with his primary care doctor regarding this.  He denies any slurred speech, facial paralysis, loss of consciousness.     Past Medical History:  Diagnosis Date   Closed head injury with concussion    Diabetes mellitus without complication (HCC)    Type II   Head injury, closed, with concussion    Hypertension    Overactive bladder    PTSD (post-traumatic stress disorder)    PTSD (post-traumatic stress disorder)     There are no active problems to display for this patient.   Past Surgical History:  Procedure Laterality Date   CARPAL TUNNEL RELEASE  Right 04/13/2016   Procedure: RIGHT CARPAL TUNNEL RELEASE;  Surgeon: Elsie Mussel, MD;  Location: [facility] OR;  Service: Orthopedics;  Laterality: Right;   CARPAL TUNNEL RELEASE Left 05/23/2016   Procedure: Left limited open carpal tunnel release ;  Surgeon: Elsie Mussel, MD;  Location: [facility] OR;  Service: Orthopedics;  Laterality: Left;  Requests 45 mins   CHOLECYSTECTOMY     COLONOSCOPY         Home Medications    Prior to Admission medications   Medication Sig Start Date End Date Taking? Authorizing Provider  aspirin EC 81 MG tablet Take 81 mg by mouth daily. Swallow whole.   Yes [provider]  hydrocortisone (ANUSOL-HC) 2.5 % rectal cream Place 1 Application rectally 2 (two) times daily for 7 days. 01/18/24 01/25/24 Yes Ailey Wessling A, PA-C  oxybutynin (DITROPAN XL) 15 MG 24 hr tablet Take 15 mg by mouth. 12/24/23  Yes [provider]  albuterol  (VENTOLIN  HFA) 108 (90 Base) MCG/ACT inhaler Inhale 1-2 puffs into the lungs every 6 (six) hours as needed for wheezing or shortness of breath. 07/28/23   Reddick, Johnathan B, NP  atorvastatin (LIPITOR) 80 MG tablet Take 80 mg by mouth daily.    [provider]  carvedilol (COREG) 6.25 MG tablet Take by mouth. 10/28/21   [provider]  cephALEXin  (KEFLEX ) 500 MG capsule Take 1 capsule (500 mg total) by mouth 3 (three) times daily. 09/26/23   Christopher Savannah, PA-C  ezetimibe (ZETIA) 10 MG tablet Take by mouth. 10/28/21   [provider]  isosorbide mononitrate (IMDUR) 30 MG 24 hr tablet isosorbide mononitrate 30 mg ER 24 hr tabs 04/24/22   [provider]  losartan (COZAAR) 100 MG tablet Take 100 mg by mouth daily.    [provider]  losartan (COZAAR) 50 MG tablet losartan 50 mg tabs 04/24/22   [provider]  metFORMIN (GLUCOPHAGE) 1000 MG tablet Take 1,000 mg by mouth 2 (two) times daily with a meal.    [provider]  nystatin  cream (MYCOSTATIN ) Apply to affected area  2 times daily 08/06/22   Mayer, Jodi R, NP  oxyCODONE  (OXY IR/ROXICODONE ) 5 MG immediate release tablet Take 1 tablet (5 mg total) by mouth every 4 (four) hours as needed (for pain score of 1-4). 05/23/16   Lawrance Rogue, PA-C  pioglitazone (ACTOS) 15 MG tablet pioglitazone 15 mg tabs 04/24/22   [provider]  promethazine -dextromethorphan (PROMETHAZINE -DM) 6.25-15 MG/5ML syrup Take 5 mLs by mouth 4 (four) times daily as needed for cough. 07/28/23   Reddick, Johnathan B, NP  ranitidine (ZANTAC) 150 MG tablet Take 150 mg by mouth 2 (two) times daily as needed for heartburn.     [provider]    Family History History reviewed. No pertinent family history.  Social History Social History   Tobacco Use   Smoking status: Former    Types: Cigarettes   Smokeless tobacco: Never   Tobacco comments:    quit at age 29  Vaping Use   Vaping status: Never Used  Substance Use Topics   Alcohol use: Not Currently   Drug use: No     Allergies   Statins, No known allergies, and Other   Review of Systems Review of Systems  Constitutional:  Negative for chills and fever.  HENT:  Negative for ear pain and sore throat.   Eyes:  Negative for pain and visual disturbance.  Respiratory:  Negative for cough and shortness of breath.   Cardiovascular:  Negative for chest pain and palpitations.  Gastrointestinal:  Positive for blood in stool and diarrhea. Negative for abdominal pain and vomiting.  Genitourinary:  Negative for dysuria and hematuria.  Musculoskeletal:  Negative for arthralgias and back pain.  Skin:  Negative for color change and rash.  Neurological:  Positive for dizziness. Negative for seizures and syncope.  All other systems reviewed and are negative.    Physical Exam Triage Vital Signs ED Triage Vitals  Encounter Vitals Group     BP 01/18/24 1339 (!) 155/92     Girls Systolic BP Percentile --      Girls Diastolic BP Percentile --      Boys Systolic BP  Percentile --      Boys Diastolic BP Percentile --      Pulse Rate 01/18/24 1339 95     Resp 01/18/24 1339 (!) 22     Temp 01/18/24 1339 98.2 F (36.8 C)     Temp Source 01/18/24 1339 Oral     SpO2 01/18/24 1339 93 %     Weight --      Height --      Head Circumference --      Peak Flow --      Pain Score 01/18/24 1335 0     Pain Loc --  Pain Education --      Exclude from Growth Chart --    No data found.  Updated Vital Signs BP (!) 155/92   Pulse 95   Temp 98.2 F (36.8 C) (Oral)   Resp (!) 22   SpO2 93%   Visual Acuity Right Eye Distance:   Left Eye Distance:   Bilateral Distance:    Right Eye Near:   Left Eye Near:    Bilateral Near:     Physical Exam Vitals and nursing note reviewed.  Constitutional:      General: He is not in acute distress.    Appearance: He is well-developed.  HENT:     Head: Normocephalic and atraumatic.     Right Ear: Tympanic membrane normal.     Left Ear: Tympanic membrane normal.     Nose: Nose normal.     Mouth/Throat:     Mouth: Mucous membranes are moist.  Eyes:     Conjunctiva/sclera: Conjunctivae normal.  Cardiovascular:     Rate and Rhythm: Normal rate and regular rhythm.     Heart sounds: No murmur heard. Pulmonary:     Effort: Pulmonary effort is normal. No respiratory distress.     Breath sounds: Normal breath sounds.  Abdominal:     Palpations: Abdomen is soft.     Tenderness: There is no abdominal tenderness.  Musculoskeletal:        General: No swelling.     Cervical back: Neck supple.  Skin:    General: Skin is warm and dry.     Capillary Refill: Capillary refill takes less than 2 seconds.  Neurological:     General: No focal deficit present.     Mental Status: He is alert and oriented to person, place, and time.  Psychiatric:        Mood and Affect: Mood normal.        Behavior: Behavior normal.        Thought Content: Thought content normal.        Judgment: Judgment normal.      UC  Treatments / Results  Labs (all labs ordered are listed, but only abnormal results are displayed) Labs Reviewed  CBC  COMPREHENSIVE METABOLIC PANEL WITH GFR  TSH    EKG   Radiology No results found.  Procedures Procedures (including critical care time)  Medications Ordered in UC Medications - No data to display  Initial Impression / Assessment and Plan / UC Course  I have reviewed the triage vital signs and the nursing notes.  Pertinent labs & imaging results that were available during my care of the patient were reviewed by me and considered in my medical decision making (see chart for details).     Blood per rectum - Plan: CBC, Comprehensive metabolic panel, CBC, Comprehensive metabolic panel  Dizziness - Plan: TSH, TSH  Internal hemorrhoid   Symptoms and physical exam findings are both consistent with an internal hemorrhoid.  On physical exam there is a small right sided hemorrhoid that protrudes under pressure but spontaneously retracts.  I do not see any active bleeding at this time.  We will treat this with hydrocortisone suppositories and recommend follow-up with gastroenterology.  Given the bleeding we will also order blood work including a complete blood count, thyroid stimulating hormone and complete metabolic panel.  These results will take approximately 24 to 48 hours to finalize.  This can also help with evaluating the dizziness that has been present for 2 months to see  if there is any abnormalities that might be contributing to this.  If there are any abnormalities that require further intervention we will contact you otherwise your results will be available on your MyChart.  Recommend following up with primary care for this as well however the symptoms do seem to be consistent with some type of positional vertigo.  This could also be evaluated by an ENT provider.  I have attached the information regarding this.  If you develop severe bleeding per rectum, persistent  bleeding per rectum, severe pain or worsening dizziness then recommend going to the emergency room for further evaluation.  Can return to urgent care as needed.  The following medications have been called into your pharmacy: Hydrocortisone rectal cream 1 application rectally twice daily for 7 days.  Final Clinical Impressions(s) / UC Diagnoses   Final diagnoses:  Blood per rectum  Dizziness  Internal hemorrhoid     Discharge Instructions      Symptoms and physical exam findings are both consistent with an internal hemorrhoid.  On physical exam there is a small right sided hemorrhoid that protrudes under pressure but spontaneously retracts.  I do not see any active bleeding at this time.  We will treat this with hydrocortisone suppositories and recommend follow-up with gastroenterology.  Given the bleeding we will also order blood work including a complete blood count, thyroid stimulating hormone and complete metabolic panel.  These results will take approximately 24 to 48 hours to finalize.  This can also help with evaluating the dizziness that has been present for 2 months to see if there is any abnormalities that might be contributing to this.  If there are any abnormalities that require further intervention we will contact you otherwise your results will be available on your MyChart.  Recommend following up with primary care for this as well however the symptoms do seem to be consistent with some type of positional vertigo.  This could also be evaluated by an ENT provider.  I have attached the information regarding this.  If you develop severe bleeding per rectum, persistent bleeding per rectum, severe pain or worsening dizziness then recommend going to the emergency room for further evaluation.  Can return to urgent care as needed.  The following medications have been called into your pharmacy: Hydrocortisone rectal cream 1 application rectally twice daily for 7 days.     ED Prescriptions      Medication Sig Dispense Auth. Provider   hydrocortisone (ANUSOL-HC) 2.5 % rectal cream Place 1 Application rectally 2 (two) times daily for 7 days. 21 g Teresa Almarie LABOR, [place]      PDMP not reviewed this encounter.   Teresa Almarie LABOR, PA-C 01/18/24 1517

## 2024-01-18 NOTE — ED Triage Notes (Signed)
 Pt states he ate pepperoni and mushroom and pinnapple and bacon pizzas from [employer] last night and about a half hour to an hour after eating he developed mid abdominal cramping, watery mouth, nausea, diarrhea. Pt states the third time he had a BM it was bright red blood and mucous. Pt states that has happened a total of 3 times with the blood and mucous, but a total of 5 times of diarrhea. Pt states has been having [id number] and thinks it may be due to his medication. Pt denies weakness.

## 2024-01-19 LAB — COMPREHENSIVE METABOLIC PANEL WITH GFR
ALT: 81 IU/L — ABNORMAL HIGH (ref 0–44)
AST: 67 IU/L — ABNORMAL HIGH (ref 0–40)
Albumin: 4.1 g/dL (ref 4.1–5.1)
Alkaline Phosphatase: 104 IU/L (ref 47–123)
BUN/Creatinine Ratio: 14 (ref 9–20)
BUN: 12 mg/dL (ref 6–24)
Bilirubin Total: 0.4 mg/dL (ref 0.0–1.2)
CO2: 18 mmol/L — ABNORMAL LOW (ref 20–29)
Calcium: 9.3 mg/dL (ref 8.7–10.2)
Chloride: 99 mmol/L (ref 96–106)
Creatinine, Ser: 0.83 mg/dL (ref 0.76–1.27)
Globulin, Total: 2.9 g/dL (ref 1.5–4.5)
Glucose: 399 mg/dL — ABNORMAL HIGH (ref 70–99)
Potassium: 4.1 mmol/L (ref 3.5–5.2)
Sodium: 137 mmol/L (ref 134–144)
Total Protein: 7 g/dL (ref 6.0–8.5)
eGFR: 113 mL/min/1.73 (ref >59)

## 2024-01-19 LAB — CBC
Hematocrit: 49.6 % (ref 37.5–51.0)
Hemoglobin: 15.7 g/dL (ref 13.0–17.7)
MCH: 29.1 pg (ref 26.6–33.0)
MCHC: 31.7 g/dL (ref 31.5–35.7)
MCV: 92 fL (ref 79–97)
Platelets: 248 x10E3/uL (ref 150–450)
RBC: 5.4 x10E6/uL (ref 4.14–5.80)
RDW: 14.5 % (ref 11.6–15.4)
WBC: 8.5 x10E3/uL (ref 3.4–10.8)

## 2024-01-19 LAB — TSH: TSH: 5.28 u[IU]/mL — ABNORMAL HIGH (ref 0.450–4.500)

## 2024-01-21 ENCOUNTER — Ambulatory Visit (HOSPITAL_COMMUNITY): Payer: Self-pay

## 2024-01-21 MED ORDER — METFORMIN HCL 1000 MG PO TABS: 1000.0000 mg | ORAL_TABLET | Freq: Two times a day (BID) | ORAL | 2 refills | Status: AC

## 2024-01-21 NOTE — Telephone Encounter (Signed)
 Please advise patient that his blood glucose levels consistent with type 2 diabetes and his TSH level is concerning for hypothyroidism.  It is very important that he resumes metformin.  I have refilled his metformin for him as a courtesy and would like for him to begin taking it today.  The prescription was sent to [facility] at [facility].  It is extremely important that he follows up with his primary care provider soon as possible for further evaluation of his abnormal TSH level.
